# Patient Record
Sex: Male | Born: 1945 | Race: White | Hispanic: No | Marital: Married | Smoking: Former smoker
Health system: Southern US, Community
[De-identification: ages and names within clinical notes are randomized; demographics above are authoritative.]

## PROBLEM LIST (undated history)

## (undated) DIAGNOSIS — G4709 Other insomnia: Secondary | ICD-10-CM

## (undated) DIAGNOSIS — G4730 Sleep apnea, unspecified: Secondary | ICD-10-CM

## (undated) DIAGNOSIS — E559 Vitamin D deficiency, unspecified: Secondary | ICD-10-CM

## (undated) DIAGNOSIS — N400 Enlarged prostate without lower urinary tract symptoms: Secondary | ICD-10-CM

## (undated) DIAGNOSIS — I509 Heart failure, unspecified: Secondary | ICD-10-CM

## (undated) DIAGNOSIS — E785 Hyperlipidemia, unspecified: Secondary | ICD-10-CM

## (undated) DIAGNOSIS — R1312 Dysphagia, oropharyngeal phase: Secondary | ICD-10-CM

## (undated) DIAGNOSIS — M1990 Unspecified osteoarthritis, unspecified site: Secondary | ICD-10-CM

## (undated) DIAGNOSIS — E119 Type 2 diabetes mellitus without complications: Secondary | ICD-10-CM

## (undated) DIAGNOSIS — E1151 Type 2 diabetes mellitus with diabetic peripheral angiopathy without gangrene: Secondary | ICD-10-CM

## (undated) DIAGNOSIS — G7080 Lambert-Eaton syndrome, unspecified: Secondary | ICD-10-CM

## (undated) DIAGNOSIS — G119 Hereditary ataxia, unspecified: Secondary | ICD-10-CM

## (undated) DIAGNOSIS — J449 Chronic obstructive pulmonary disease, unspecified: Secondary | ICD-10-CM

## (undated) DIAGNOSIS — I2510 Atherosclerotic heart disease of native coronary artery without angina pectoris: Secondary | ICD-10-CM

## (undated) DIAGNOSIS — J690 Pneumonitis due to inhalation of food and vomit: Secondary | ICD-10-CM

## (undated) DIAGNOSIS — M069 Rheumatoid arthritis, unspecified: Secondary | ICD-10-CM

## (undated) DIAGNOSIS — D638 Anemia in other chronic diseases classified elsewhere: Secondary | ICD-10-CM

## (undated) DIAGNOSIS — K219 Gastro-esophageal reflux disease without esophagitis: Secondary | ICD-10-CM

## (undated) DIAGNOSIS — I110 Hypertensive heart disease with heart failure: Secondary | ICD-10-CM

## (undated) DIAGNOSIS — R918 Other nonspecific abnormal finding of lung field: Secondary | ICD-10-CM

## (undated) DIAGNOSIS — I739 Peripheral vascular disease, unspecified: Secondary | ICD-10-CM

## (undated) DIAGNOSIS — K529 Noninfective gastroenteritis and colitis, unspecified: Secondary | ICD-10-CM

## (undated) DIAGNOSIS — G709 Myoneural disorder, unspecified: Secondary | ICD-10-CM

## (undated) HISTORY — DX: Noninfective gastroenteritis and colitis, unspecified: K52.9

## (undated) HISTORY — DX: Dysphagia, oropharyngeal phase: R13.12

## (undated) HISTORY — DX: Myoneural disorder, unspecified: G70.9

## (undated) HISTORY — DX: Pneumonitis due to inhalation of food and vomit: J69.0

## (undated) HISTORY — DX: Anemia in other chronic diseases classified elsewhere: D63.8

## (undated) HISTORY — DX: Type 2 diabetes mellitus with diabetic peripheral angiopathy without gangrene: E11.51

## (undated) HISTORY — DX: Peripheral vascular disease, unspecified: I73.9

## (undated) HISTORY — DX: Other insomnia: G47.09

## (undated) HISTORY — DX: Hypertensive heart disease with heart failure: I11.0

## (undated) HISTORY — DX: Unspecified osteoarthritis, unspecified site: M19.90

## (undated) HISTORY — DX: Other nonspecific abnormal finding of lung field: R91.8

## (undated) HISTORY — DX: Vitamin D deficiency, unspecified: E55.9

## (undated) HISTORY — DX: Gastro-esophageal reflux disease without esophagitis: K21.9

## (undated) HISTORY — DX: Chronic obstructive pulmonary disease, unspecified: J44.9

## (undated) HISTORY — DX: Heart failure, unspecified: I50.9

## (undated) HISTORY — DX: Hereditary ataxia, unspecified: G11.9

## (undated) HISTORY — DX: Sleep apnea, unspecified: G47.30

---

## 2000-10-11 ENCOUNTER — Encounter (INDEPENDENT_AMBULATORY_CARE_PROVIDER_SITE_OTHER): Payer: Self-pay | Admitting: *Deleted

## 2000-10-11 ENCOUNTER — Encounter: Payer: Self-pay | Admitting: Emergency Medicine

## 2000-10-11 ENCOUNTER — Inpatient Hospital Stay (HOSPITAL_COMMUNITY): Admission: EM | Admit: 2000-10-11 | Discharge: 2000-10-19 | Payer: Self-pay | Admitting: Emergency Medicine

## 2000-10-12 ENCOUNTER — Encounter: Payer: Self-pay | Admitting: Neurology

## 2000-10-15 ENCOUNTER — Encounter: Payer: Self-pay | Admitting: Neurology

## 2000-10-17 ENCOUNTER — Encounter: Payer: Self-pay | Admitting: Neurology

## 2000-10-19 ENCOUNTER — Inpatient Hospital Stay (HOSPITAL_COMMUNITY)
Admission: RE | Admit: 2000-10-19 | Discharge: 2000-10-27 | Payer: Self-pay | Admitting: Physical Medicine & Rehabilitation

## 2000-11-01 ENCOUNTER — Encounter
Admission: RE | Admit: 2000-11-01 | Discharge: 2000-11-29 | Payer: Self-pay | Admitting: Physical Medicine & Rehabilitation

## 2000-11-29 ENCOUNTER — Encounter: Payer: Self-pay | Admitting: Neurology

## 2000-11-29 ENCOUNTER — Inpatient Hospital Stay (HOSPITAL_COMMUNITY): Admission: AD | Admit: 2000-11-29 | Discharge: 2000-12-04 | Payer: Self-pay | Admitting: Neurology

## 2000-11-30 ENCOUNTER — Encounter: Payer: Self-pay | Admitting: Neurology

## 2000-12-22 ENCOUNTER — Encounter (HOSPITAL_COMMUNITY): Admission: RE | Admit: 2000-12-22 | Discharge: 2001-03-22 | Payer: Self-pay | Admitting: Neurology

## 2001-01-05 ENCOUNTER — Encounter: Payer: Self-pay | Admitting: *Deleted

## 2001-01-09 ENCOUNTER — Encounter: Payer: Self-pay | Admitting: *Deleted

## 2001-01-09 ENCOUNTER — Ambulatory Visit (HOSPITAL_COMMUNITY): Admission: RE | Admit: 2001-01-09 | Discharge: 2001-01-09 | Payer: Self-pay | Admitting: *Deleted

## 2001-01-11 ENCOUNTER — Encounter (HOSPITAL_COMMUNITY): Admission: RE | Admit: 2001-01-11 | Discharge: 2001-04-11 | Payer: Self-pay | Admitting: Neurology

## 2001-03-03 ENCOUNTER — Encounter: Payer: Self-pay | Admitting: Neurology

## 2001-03-03 ENCOUNTER — Encounter: Admission: RE | Admit: 2001-03-03 | Discharge: 2001-03-03 | Payer: Self-pay | Admitting: Neurology

## 2001-03-08 ENCOUNTER — Inpatient Hospital Stay (HOSPITAL_COMMUNITY): Admission: AD | Admit: 2001-03-08 | Discharge: 2001-03-13 | Payer: Self-pay | Admitting: Neurology

## 2001-03-09 ENCOUNTER — Encounter: Payer: Self-pay | Admitting: Neurology

## 2001-03-10 ENCOUNTER — Encounter: Payer: Self-pay | Admitting: Neurology

## 2001-04-12 ENCOUNTER — Encounter: Admission: AD | Admit: 2001-04-12 | Discharge: 2001-07-11 | Payer: Self-pay | Admitting: Neurology

## 2001-08-03 ENCOUNTER — Encounter (HOSPITAL_COMMUNITY): Admission: RE | Admit: 2001-08-03 | Discharge: 2001-11-01 | Payer: Self-pay | Admitting: Neurology

## 2001-08-17 ENCOUNTER — Ambulatory Visit (HOSPITAL_COMMUNITY): Admission: RE | Admit: 2001-08-17 | Discharge: 2001-08-17 | Payer: Self-pay | Admitting: Neurology

## 2001-08-17 ENCOUNTER — Encounter: Payer: Self-pay | Admitting: Neurology

## 2001-09-22 ENCOUNTER — Encounter: Payer: Self-pay | Admitting: Neurology

## 2001-09-22 ENCOUNTER — Emergency Department (HOSPITAL_COMMUNITY): Admission: EM | Admit: 2001-09-22 | Discharge: 2001-09-23 | Payer: Self-pay | Admitting: Emergency Medicine

## 2001-10-04 ENCOUNTER — Encounter (HOSPITAL_COMMUNITY): Admission: RE | Admit: 2001-10-04 | Discharge: 2002-01-02 | Payer: Self-pay | Admitting: Neurology

## 2002-01-04 ENCOUNTER — Encounter (HOSPITAL_COMMUNITY): Admission: RE | Admit: 2002-01-04 | Discharge: 2002-04-04 | Payer: Self-pay | Admitting: Neurology

## 2002-04-16 ENCOUNTER — Ambulatory Visit (HOSPITAL_COMMUNITY): Admission: RE | Admit: 2002-04-16 | Discharge: 2002-04-16 | Payer: Self-pay | Admitting: Neurology

## 2002-04-16 ENCOUNTER — Encounter: Payer: Self-pay | Admitting: Neurology

## 2002-12-14 ENCOUNTER — Encounter: Payer: Self-pay | Admitting: Neurology

## 2002-12-14 ENCOUNTER — Encounter: Admission: RE | Admit: 2002-12-14 | Discharge: 2002-12-14 | Payer: Self-pay | Admitting: Neurology

## 2003-09-10 ENCOUNTER — Ambulatory Visit (HOSPITAL_COMMUNITY): Admission: RE | Admit: 2003-09-10 | Discharge: 2003-09-10 | Payer: Self-pay | Admitting: Neurology

## 2004-01-17 ENCOUNTER — Ambulatory Visit (HOSPITAL_COMMUNITY): Admission: RE | Admit: 2004-01-17 | Discharge: 2004-01-17 | Payer: Self-pay | Admitting: Neurology

## 2004-02-08 ENCOUNTER — Inpatient Hospital Stay (HOSPITAL_COMMUNITY): Admission: EM | Admit: 2004-02-08 | Discharge: 2004-02-12 | Payer: Self-pay | Admitting: Emergency Medicine

## 2004-02-08 HISTORY — PX: INTERTROCHANTERIC HIP FRACTURE SURGERY: SHX681

## 2005-11-25 ENCOUNTER — Ambulatory Visit (HOSPITAL_COMMUNITY): Admission: RE | Admit: 2005-11-25 | Discharge: 2005-11-25 | Payer: Self-pay | Admitting: Internal Medicine

## 2007-06-01 ENCOUNTER — Ambulatory Visit (HOSPITAL_BASED_OUTPATIENT_CLINIC_OR_DEPARTMENT_OTHER): Admission: RE | Admit: 2007-06-01 | Discharge: 2007-06-01 | Payer: Self-pay | Admitting: Orthopedic Surgery

## 2008-07-14 ENCOUNTER — Emergency Department (HOSPITAL_BASED_OUTPATIENT_CLINIC_OR_DEPARTMENT_OTHER): Admission: EM | Admit: 2008-07-14 | Discharge: 2008-07-14 | Payer: Self-pay | Admitting: Emergency Medicine

## 2008-07-14 ENCOUNTER — Ambulatory Visit: Payer: Self-pay | Admitting: Diagnostic Radiology

## 2008-12-06 ENCOUNTER — Ambulatory Visit (HOSPITAL_COMMUNITY): Admission: RE | Admit: 2008-12-06 | Discharge: 2008-12-06 | Payer: Self-pay | Admitting: Internal Medicine

## 2008-12-10 ENCOUNTER — Ambulatory Visit (HOSPITAL_COMMUNITY): Admission: RE | Admit: 2008-12-10 | Discharge: 2008-12-10 | Payer: Self-pay | Admitting: Internal Medicine

## 2010-07-23 ENCOUNTER — Other Ambulatory Visit (HOSPITAL_BASED_OUTPATIENT_CLINIC_OR_DEPARTMENT_OTHER): Payer: Self-pay | Admitting: Internal Medicine

## 2010-07-23 DIAGNOSIS — R918 Other nonspecific abnormal finding of lung field: Secondary | ICD-10-CM

## 2010-07-23 DIAGNOSIS — F17200 Nicotine dependence, unspecified, uncomplicated: Secondary | ICD-10-CM

## 2010-07-31 ENCOUNTER — Ambulatory Visit (HOSPITAL_COMMUNITY)
Admission: RE | Admit: 2010-07-31 | Discharge: 2010-07-31 | Disposition: A | Discharge status: Home / Self Care | Payer: MEDICARE | Source: Ambulatory Visit | Attending: Internal Medicine | Admitting: Internal Medicine

## 2010-07-31 DIAGNOSIS — J438 Other emphysema: Secondary | ICD-10-CM | POA: Insufficient documentation

## 2010-07-31 DIAGNOSIS — J90 Pleural effusion, not elsewhere classified: Secondary | ICD-10-CM | POA: Insufficient documentation

## 2010-07-31 DIAGNOSIS — J984 Other disorders of lung: Secondary | ICD-10-CM | POA: Insufficient documentation

## 2010-07-31 DIAGNOSIS — J8410 Pulmonary fibrosis, unspecified: Secondary | ICD-10-CM | POA: Insufficient documentation

## 2010-07-31 DIAGNOSIS — K7689 Other specified diseases of liver: Secondary | ICD-10-CM | POA: Insufficient documentation

## 2010-07-31 DIAGNOSIS — N62 Hypertrophy of breast: Secondary | ICD-10-CM | POA: Insufficient documentation

## 2010-07-31 DIAGNOSIS — F17200 Nicotine dependence, unspecified, uncomplicated: Secondary | ICD-10-CM

## 2010-07-31 DIAGNOSIS — I2510 Atherosclerotic heart disease of native coronary artery without angina pectoris: Secondary | ICD-10-CM | POA: Insufficient documentation

## 2010-07-31 DIAGNOSIS — R918 Other nonspecific abnormal finding of lung field: Secondary | ICD-10-CM

## 2010-07-31 MED ORDER — IOHEXOL 300 MG/ML  SOLN
80.0000 mL | Freq: Once | INTRAMUSCULAR | Status: AC | PRN
  Administered 2010-07-31: 80 mL via INTRAVENOUS

## 2010-10-02 NOTE — H&P (Signed)
. [facility]  Patient:    Justin Clements, Justin Clements                   MRN: [id number] Adm. Date:  [id number] Attending:  Lesly Dukes CC:         [place]. Andrey Campanile, M.D.  Thomas C. Wall, M.D. [facility]   History and Physical  CHIEF COMPLAINT: Justin Clements is a 65 year old right-handed white male, born 12/12/1945, with a history of coronary artery disease and hypercholesterolemia, who presents with a one week history of problems with staggering walking and dizziness.  HISTORY OF PRESENT ILLNESS: The patient blacked out two days prior to this admission.  He did not fall down with the brief syncope.  He has had a history of dropping mail from either hand over the last month or so.  The patient, however, developed worsening slurred speech and difficulty with ambulation tonight and was seeing brown spots in front of his eyes.  He did not have a headache and did not have nausea or vomiting.  He was seeing double.  The patient has recently been on some antibiotics for presumed sinus problem.  He had significant problems today and was brought into the hospital for evaluation.  CT scan of the head shows evidence of a small left pontine infarct, questionable age; otherwise CT is unremarkable.  The patient is admitted for full stroke evaluation.  PAST MEDICAL HISTORY:  1. New onset gait disturbance, slurred speech; rule out vertebrobasilar     insufficiency.  2. Coronary artery disease, history of MI at age 42.  3. History of hypercholesterolemia.  4. History of heel spur surgery.  5. History of deviated septum surgery.  6. History of benign prostatic hypertrophy.  7. History of peptic ulcer disease.  CURRENT MEDICATIONS:  1. Simvastatin 40 mg q.d.  2. Claritin 10 mg q.d.  3. Terazosin 10 mg q.h.s.  4. Aspirin one tablet q.d.  SOCIAL HISTORY: The patient smokes a pack of cigarettes daily and does not drink alcohol.  The patient is married  and lives in the [place] area.  He works as a Advertising account planner.  He has two children alive and well.  ALLERGIES: None.  FAMILY HISTORY: Mother died, history of diabetes.  Father died with cancer.  A sister died with diabetes.  A brother died with MI.  The patient has three living brothers.  REVIEW OF SYSTEMS: No fever or chills.  The patient does note some slight neck pain.  Denies shortness of breath, chest pain, bowel or bladder control problems.  PHYSICAL EXAMINATION:  VITAL SIGNS: Blood pressure 128/78, heart rate 70, respiratory rate 14. Afebrile.  GENERAL: This patient is a fairly well-developed white male, who is alert and cooperative at the time of examination.  HEENT: Head atraumatic.  PERRL, discs flat bilaterally.  NECK: Supple.  No carotid bruits noted.  CHEST: Clear to auscultation and percussion.  CARDIOVASCULAR: Regular rate and rhythm.  No obvious murmurs or rubs noted.  EXTREMITIES: Without significant edema.  NEUROLOGIC: Cranial nerves as above.  Facial symmetry present.  Good sensation of face to pinprick and soft touch bilaterally.  Good strength in facial muscles and muscles with head turning and shoulder shrug bilaterally.  Speech well enunciated, not aphasic.  Motor testing reveals 5/5 strength in all fours.  Good symmetric motor tone noted throughout.  Sensory testing intact to pinprick, soft touch, vibratory sensation throughout.  The patient may have some slight decrease  in pinprick sensation of the right hand as compared to the left.  The patient has good finger-to-nose-to-finger and toe-to-finger bilaterally.  The patient was not ambulated.  Deep tendon reflexes are symmetric and normal.  Toes neutral bilaterally.  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  WBC 7.0, hemoglobin 16.4, hematocrit 47.1; MCV 84.8; platelets 359,000. Urinalysis reveals a specific gravity of 1.014,  pH 5.5; otherwise unremarkable.  Sodium 137, potassium 3.8, chloride 110, CO2 24, glucose 123, BUN 12, creatinine 0.7.  Calcium 8.6, total protein 6.5, albumin 3.2, AST 19, ALT 22, alkaline phosphatase 103, total bilirubin 0.7.  EKG reveals normal sinus rhythm, possible inferior infarct - age indeterminate.  IMPRESSION:  1. History of gait disturbance, dysarthria, rule out vertebrobasilar     insufficiency.  2. Coronary artery disease.  3. Hypercholesterolemia.  This patient patient has ongoing tobacco abuse as well.  The patient has had new symptoms over the last week or so.  Will admit the patient for further evaluation.  Evidence of left pontine stroke noted by CT.  PLAN:  1. MRI scan of the brain.  2. MR angiogram.  3. Carotid Doppler study.  4. Obtain 2D echocardiogram.  5. IV heparin therapy.  6. Will follow the patients clinical course while in-house. DD:  10/11/00 TD:  10/12/00 Job: [id number]

## 2010-10-02 NOTE — Op Note (Signed)
[place]. [facility]  Patient:    Justin Clements, Justin Clements Visit Number: [id number] MRN: [id number]          Service Type: DSU Location: [facility] 2899 18 Attending Physician:  Caralee Ates Dictated by:   Caralee Ates, M.D. Proc. Date: 01/09/01 Admit Date:  01/09/2001 Discharge Date: 01/09/2001                             Operative Report  PREOPERATIVE DIAGNOSIS:  Multiple sclerosis.  POSTOPERATIVE DIAGNOSIS:  Multiple sclerosis.  PROCEDURE:  Right IJ Ash catheter placement.  SURGEON:  Caralee Ates, M.D.  ANESTHESIA:  MAC plus 1% lidocaine.  ESTIMATED BLOOD LOSS:  Minimal.  DRAINS:  None.  SPECIMENS:  None.  COMPLICATIONS:  None.  BRIEF HISTORY:  The patient is a 65 year old gentleman with multiple sclerosis and is felt to be in need of plasmapheresis.  CVTS is consulted for placement of an Ash catheter for his plasmapheresis.  The risks, benefits and alternatives of the procedure were explained in detail to the patient; he agreed to proceed.  DESCRIPTION OF PROCEDURE:  The patient was brought to the operating room and placed on the operating room table in the supine position.  Following adequate IV sedation, the right neck was draped and prepped in sterile fashion.  An area on the anterior of neck was anesthetized with 1% lidocaine and the right internal jugular vein was percutaneously punctured.  A guide wire was advanced through the needle into the [facility].  Next, a small stab incision was made at this level.  Next, an area on the anterior chest below the clavicle laterally was anesthetized with 1% lidocaine.  A small transverse incision was then made. An Ash catheter was then passed using the tunneling device through the subcutaneous plane and brought out into the upper wound.  Serial dilatation was performed over the guide wire, following by advancement of a large-caliber extensor and peel-away sheath system under direct fluoroscopic vision.   These and the guide wire were removed, and the Ash catheter was advanced down the peel-away sheath and the peel-away sheath was removed.  The tip of the catheter was then identified at the ______ junction; there is no kink in the insertion site.  Both ports were flushed and aspirated well.  The wound was closed with interrupted 4-0 Vicryl suture.  The skin was secured at the level of the skin with interrupted 4-0 nylon suture.  Sterile dressings were applied.  The patient was awakened from anesthesia and transferred to the recovery room in stable condition.  The patient tolerated the procedure well. No complications.  All needle and sponge counts were correct. Dictated by:   Caralee Ates, M.D. Attending Physician:  Caralee Ates. DD:  02/22/01 TD:  02/23/01 Job: [id number]

## 2010-10-02 NOTE — Discharge Summary (Signed)
[place]. [facility]  Patient:    Justin Clements, Justin Clements                   MRN: [id number] Adm. Date:  [id number] Disc. Date: 10/27/00 Attending:  Herold Clements Dictator:   Justin Clements, P.A. CCMarlan Clements, M.D.  Justin Clements, M.D.   Discharge Summary  DISCHARGE DIAGNOSES: 1. Cerebellar syndrome with encephalitis. 2. Hyperlipidemia. 3. Coronary artery disease with myocardial infarction.  HISTORY OF PRESENT ILLNESS:  This is a 65 year old, right-handed white male admitted on May 28, with progressive gait disorder, slurred speech and dizziness x 1 month.  There was no chest pain or shortness of breath.  Upon evaluation, cranial CT scan with small left pontine infarction of questionable age.  MRI with chronic infarction of the left pons.  MRA without aneurysm. Lumbar puncture on May 29, with wbcs 78, rbcs 2 and culture negative.  He had a neurology consult with Dr. 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  He was placed on intravenous Solu-Medrol and steroid taper.  A follow up lumbar puncture on June 3, showed an RBC of 3440, WBC 24,000, glucose 121.  He required minimal assistance for ambulation.  Fatigue factors with his gait being very unsteady.  Latest chemistries were unremarkable.  CT of the chest on June 1, was negative.  He was admitted for comprehensive rehabilitation program.  PAST MEDICAL HISTORY:  See discharge diagnoses.  Noted history of tobacco abuse.  ALLERGIES:  No known drug allergies.  PRIMARY PHYSICIAN:  Dr. Margrett Clements.  MEDICATIONS: 1. Zocor 40 mg daily. 2. Claritin 10 mg daily. 3. Terazosin 10 mg at bed time. 4. Aspirin daily.  SOCIAL HISTORY:  He lives in [place] with his wife.  He was independent prior to admission and employed with the postal service.  Wife to assist as needed on discharge.  He has one-level home with two steps to  entry.  HOSPITAL COURSE:  The patient did well on rehabilitation services with therapies initiated on a b.i.d. basis.  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  He continued on the steroid taper as per neurology services.  He remained afebrile.  He did have a bout of dizziness that showed no blood pressure changes that had resolved.  Overall for his functional mobility, he was ambulating 150 feet with a rolling walker and supervision, supervision for activities of daily living with dressing, grooming and homemaking.  Overall, his strength and endurance greatly improved.  The patient was encouraged with progress.  The plan was for outpatient therapies ongoing.  He was advised no driving at this time.  His aspirin was resumed for history of coronary artery disease with myocardial infarction.  He continued on his Zocor for his hyperlipidemia.  He had no bowel or bladder disturbances.  His mood and spirits continued to improve as he was followed by neuropsychologist, Dr. Eula Clements.  Latest labs showed a hemoglobin of 17.3, hematocrit 50, WBC 12.5 and platelets 345.  Sodium 137, potassium 3.8, BUN 18, creatinine 0.8.  DISCHARGE MEDICATIONS: 1. Hytrin 2 mg at bedtime. 2. Pepcid 20 mg twice daily. 3. Prednisone taper as directed. 4. Ecotrin 325 mg daily. 5. Multivitamin daily. 6. Zocor 20 mg daily.  ACTIVITY:  As tolerated.  DIET:  Regular.  SPECIAL INSTRUCTIONS:  No driving.  Outpatient physical and occupational therapy.  FOLLOWUP:  Arrangement appointments with Dr. Lesia Clements, call [phone], for neurology services.  Followup with Dr. Ellwood Clements, call [phone], for appointment.  He will continue to see Dr. Margrett Clements for his medical management. DD:   10/26/00 TD:  10/26/00 Job: [id number]

## 2010-10-02 NOTE — Discharge Summary (Signed)
[place]. [facility]  Patient:    Justin Clements, Justin Clements Visit Number: [id number] MRN: [id number]          Service Type: MED Location: 3000 3027 01 Attending Physician:  Lesly Dukes Dictated by:   Marlan Palau, M.D. Admit Date:  03/08/2001 Discharge Date: 03/13/2001   CC:         [facility], [address] [facility].                           Discharge Summary  ADMISSION DIAGNOSES: 1. 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 2. Recent onset of confusion, etiology unclear.  DISCHARGE DIAGNOSES: 1. 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 2. Confusion, possibly encephalopathy associated with #1.  PROCEDURES:  During this admission included: 1. MRI of the brain. 2. EEG study. 3. CT of the abdomen and pelvis.  COMPLICATIONS:  Of above procedures, none.  HISTORY OF PRESENT ILLNESS:  Justin Clements is a 65 year old white male born on 10/03/1945, with a history of coronary artery disease and hypercholesterolemia.  The patient has had a progressive cerebellar syndrome with associated with anti-LEMS antibodies.  The patient has had spinal taps x2 in the past with associated elevation and white cell counts.  The patient had developed some problem with confusion.  Some question of whether there may be some underlying depression is noted.  The patient was brought to the hospital for further evaluation, rule out any current illness changes on MRI scanning procedure.  PAST MEDICAL HISTORY: 1. 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 2. Coronary artery disease. 3. Hypercholesterolemia. 4. Heel spur surgery. 5. Deviated septum. 6. Benign prostatic hypertrophy. 7. Peptic ulcer disease. 8. Confusion as above.  MEDICATIONS:  Prior to admission include: 1. Zocor 20  mg a day. 2. Claritin 10 mg a day. 3. Terazosin 2 mg q.h.s. 4. Aspirin 81 mg daily. 5. Prednisone reduced to 40 mg a day. 6. Imuran 50 mg a day.  ALLERGIES:  No known allergies.  HABITS:  He has smoked a pack of cigarettes a day.  He is now off cigarettes. He does not drink alcohol.  Please refer to the history and physical dictation summary for social history, family history, review of systems, and physical examination.  LABORATORY VALUES:  Notable for TSH of 1.370, white count 11.1, hemoglobin 13.8, hematocrit 41.7, MCV 91.1, platelets 328, coags unremarkable.  Sodium 141, potassium 3.6, chloride 103, CO2 29, glucose 163, BUN 18, creatinine 0.8, calcium 9.1.  Total protein 5.3, albumin 3.9, AST of 24, ALT 38, ALP of 37, total bilirubin 0.7, TSH of 1.370.  CT of the abdomen and pelvis were unremarkable.  His following study was unremarkable.  MRI scan of the brain showed no acute changes.  Mild atrophy is seen without significant small-vessel disease.  CT of the chest was negative.  HOSPITAL COURSE:  The patient was admitted for further evaluation.  The prednisone dose was reduced slightly as this could potentially be a source of confusion.  The patient was reduced to 30 mg a day.  The patient did undergo the MRI scan of the brain that was unremarkable.  EEG study was done and it was unremarkable.  CT of the abdomen and pelvis was done to rule out adenocarcinoma.  This was unremarkable.  Prior CT of the  chest done before this admission was unremarkable.  There was some considerations whether a PET scan may be useful.  We will consider obtaining this following discharge.  The patient was seen by the case management team and question of whether any assistance in the home environment could be obtained.  It is not clear that this patient is eligible for this.  Physical, occupational, and speech therapy has seen this patient.  Swallowing evaluation was done.  It appeared to be good.   The patient has improved somewhat with mental status and was started on some Zoloft for possible depression at 25 mg a day.  This will be continued. The patient will continue on Imuran as well.  The patient is continued on plasmapheresis which was continued while in the hospital.  Plans are, at this point, for discharge to home on the 28th of October 2002. The patient has refused spinal tap during this hospitalization.  DISCHARGE MEDICATIONS: 1. Zocor 20 mg a day. 2. Claritin 10 mg a day. 3. Terazosin 2 mg q.h.s. 4. Aspirin 81 mg a day. 5. Prednisone 30 mg a day. 6. Imuran 50 mg daily. 7. Zoloft 25 mg a day.  The patient will follow up with [facility] in 4-6 weeks. Plasmapheresis will be continued twice a week.  The patient has made very little progress with the plasmapheresis and likely has progressed some while on this therapy.  Prognosis remains guarded. Dictated by:   Marlan Palau, M.D. Attending Physician:  Lesly Dukes DD:  03/12/01 TD:  03/14/01 Job: [id number]

## 2010-10-02 NOTE — Discharge Summary (Signed)
NAME:  Justin Clements, Justin Clements NO.:  [id number]   MEDICAL RECORD NO.:  [id number]          PATIENT TYPE:  INP   LOCATION:  5016                         FACILITY:  [facility]   PHYSICIAN:  Madlyn Frankel. Charlann Boxer, M.D.  DATE OF BIRTH:  1945-10-19   DATE OF ADMISSION:  02/08/2004  DATE OF DISCHARGE:  02/12/2004                                 DISCHARGE SUMMARY   ADMITTING DIAGNOSES:  1.  Left hip intertrochanteric femoral fracture.  2.  Coronary artery disease.  3.  Hypercholesterolemia.  4.  Rheumatoid arthritis.  5.  Benign prostatic hypertrophy.  6.  Progressive memory disturbance.  7.  History of progressive cerebellar ataxia, with EMS bodies, being      followed by Dr. Anne Hahn.  8.  History of duodenal ulcer.   DISCHARGE DIAGNOSES:  1.  Left hip intertrochanteric femoral fracture.  2.  Coronary artery disease.  3.  Hypercholesterolemia.  4.  Rheumatoid arthritis.  5.  Benign prostatic hypertrophy.  6.  Progressive memory disturbance.  7.  History of progressive cerebellar ataxia, with EMS bodies, being      followed by Dr. Anne Hahn.  8.  History of duodenal ulcer.   OPERATION:  On February 10, 2004, the patient under open reduction and  internal fixation left hip intertrochanteric femoral fracture utilizing  Synthes intertrochanteric nail.   CONSULTS:  1.  [facility] medical with Raynelle Fanning A. Mayford Knife, M.D., Rosanne Sack, M.D.  2.  Neurology consult, Dr. Pearlean Brownie.  3.  Dr. Mayford Knife of hospice palliative care.   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  He had immediate onset of  left hip pain.  X-rays in the emergency room revealed a nondisplaced  trochanteric fracture of the left hip.  CT scan was ordered which confirmed  the extension of the fracture through the trochanter.  This gentleman is a  relatively active fellow and was able to do transfers, doing some things  himself.  This type fracture is quite unstable for any kind of weightbearing  or ambulation or even transfers, and long bed rest would be the only way to  treat this so it could heal on its own.  We therefore elected to put gamma  nail in there so the patient could begin activity as soon as he was  comfortable postoperatively.   COURSE IN THE HOSPITAL:  The patient tolerated the surgical procedure quite  well.  Due to his physical deformity, he is very slow in his level of  activity.  He had minimal discomfort in the left lower extremity.  He was  somewhat apprehensive as far as his activity was concerned.  As a matter of  fact, reviewing the physical therapy records, they actually did minimal  therapy in the hospital.  This will be reserved, initiated, and continued at  [facility].   Orthopedically, the patient remained stable postoperatively.  Hemoglobin was  stable.  Neurovascularly, he remained intact in the left lower extremity.  On the day of discharge, the  patient was awake.  He was conversant as far as  he was able, having minimal discomfort in the left lower extremity.  Flexion  of the hip to about 40 degrees did not elicit any discomfort, and internal  and external rotation the same.  He was cleared by Dr. Jamie Brookes of [facility] for transfer back to [facility].  Once  those plans were in place, it was perfectly all right orthopedically for him  to be discharged.  We had allowed weightbearing as tolerated during his  hospitalization.   Laboratory values in the hospital hematologically showed a CBC with  differential showing a preoperative hemoglobin of 14.9, with hematocrit of  43.  He did have an elevated white count of 11.7 preop.  However, this  normalized to 7.1.  Final hemoglobin was 13.7, hematocrit was 40.8.  Blood  chemistries all were normal, other than a very slightly elevated glucose at  125.  Chest x-ray  showed mild cardiomegaly without evidence of acute  disease.  Electrocardiogram showed normal sinus rhythm, with rightward axis,  nonspecific T wave abnormal.   CONDITION ON DISCHARGE:  Improved, stable.   PLAN:  The patient is discharged to [facility] to  continue with treatment neurologically under Dr. Anne Hahn, and medically under  the attending physicians there.  Orthopedically, he may weightbear as  tolerated.  Staples may be removed at 10-14 days.  Use Steri-Strips as  needed.  Encourage range of motion of left lower extremity and ankle pumps.  He is to return to see Dr. Charlann Boxer in about six weeks.  Call for an appointment  at [phone].  This will probably be just a single visit with Dr. Charlann Boxer to  assess the x-rays and progress of the patient with his physical therapy as  able.  Patient to continue on Coumadin protocol four weeks after the date of  surgery, keeping the INR around 2.   MEDICATIONS AT DISCHARGE:  1.  Zoloft 50 mg one daily.  2.  Duragesic 75 mcg patch q.72 h.  3.  Calcium carbonate tablet 500 mg one t.i.d.  4.  Therapeutic vitamins 1 daily.  5.  Sodium chloride nasal spray 0.65 [place] at bedtime.  6.  Flomax 0.4 mg at bedtime.  7.  Toprol XL 25 mg one daily.  8.  Coumadin per pharmacy protocol.  9.  Colace 100 mg one b.i.d.  10. Pepcid 20 mg one p.o. b.i.d. p.r.n.  11. Vicodin for discomfort.  12. Ensure p.r.n.  13. Tylenol may be used on a p.r.n. basis as well.   The patient was being treated for constipation at the time of discharge and  received Dulcolax suppository on February 11, 2004.       DLU/MEDQ  D:  02/12/2004  T:  02/12/2004  Job:  [id number]   cc:   Marlan Palau, M.D.  [address]  [address]  [place]  [place] [address]  Fax: [phone]   Allen Kell, M.D.  [address]  [place], [place] [address]  Fax: [phone]

## 2010-10-02 NOTE — Consult Note (Signed)
NAME:  Justin Clements, Justin Clements NO.:  [id number]   MEDICAL RECORD NO.:  [id number]          PATIENT TYPE:  INP   LOCATION:  5016                         FACILITY:  [facility]   PHYSICIAN:  Pramod P. Pearlean Brownie, MD    DATE OF BIRTH:  March 21, 1946   DATE OF CONSULTATION:  02/09/2004  DATE OF DISCHARGE:                                   CONSULTATION   REASON FOR REFERRAL:  Cerebellar ataxia. Requesting preop neurological  clearance for hip surgery.   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  He has had  progressive speech disturbances as well as balance problems.  He has  progressed from walking independently to a walker, and for the last couple  of years he is unable to walk even with assistance.  He uses a wheelchair  most of  the time and is able to walk only short distances with walker with  assistance.  He had a fall and fractured his left hip and is awaiting hip  surgery.  We have been asked to consult for neurological clearance prior to  surgery.  The patient states he has had no recent worsening of his speech or  cerebellar ataxia.  He has been treated in the past by Dr. Lesia Sago from  our practice.  His anti-LEMS antibodies were positive.  His spinal tap had  shown increased white cells, but with normal glucose and protein.  MRI scan  of the brain did not reveal any evidence of encephalitis or any other  structural brain abnormality.  He was treated with prednisone as well as  several courses of plasma exchange without any clinical benefit.  He  recently had outpatient PET CT, which again, was negative for any  malignancy.   PAST MEDICAL HISTORY:  Significant for coronary artery disease,  hyperlipidemia, peptic ulcer, prostatic hypertrophy.   PAST SURGICAL HISTORY:  Nasal septum surgery, heel spur surgery.   PRESENT MEDICATIONS:  1.  Zoloft .  2.  Duragesic patch.  3.   Calcium  4.  Colace.  5.  Zantac.  6.  Restoril.  7.  Lortab.  8.  Flomax.  9.  Darvocet.   ALLERGIES:  None.   SOCIAL HISTORY:  The patient had smoked one pack a day for several years.  He quit three years ago and does not drink alcohol.  He is married and lives  in [place].  He used to work as a Advertising account planner.   FAMILY HISTORY:  Not significant for any cerebellar ataxia syndrome.   REVIEW OF SYSTEMS:  Significant for recent fall, hip pain.  No cough, chest  pain, diarrhea, shortness of breath.   PHYSICAL EXAMINATION:  GENERAL:  Pleasant middle age gentleman who is not in  distress.  VITAL SIGNS:  He is afebrile.  Pulse rate 70, respiratory rate 16.  Distal  pulses well felt.  HEENT:  Head nontraumatic.  ENT:  Unremarkable.  NECK:  Supple without bruit.  CARDIAC:  No murmur or gallop.  LUNGS:  Clear to auscultation.  NEUROLOGIC:  The patient is awake, alert and oriented x3.  There is no  aphasia.  He has severe dysarthria which is of the scanning cerebellar type.  He has increasing grimacing while speaking.  Eye movements are full range,  but there are dysmetric saccades bilaterally.  Visual acuity and fields are  adequate.  Face is symmetric.  Palate elevates normally.  Tongue is midline.  Motor system exam:  There is no focal weakness.  Left lower extremity muscle  strength testing is limited secondary to pain.  He has severe finger-to-nose  and knee-to-heel dysmetria.  Dysdiadochokinesia is present bilaterally.  Reflexes are brisk and symmetric.  Toes are downgoing.  Sensation is intact.  His gait was not tested.   DATA REVIEWED:  MRI scan of the brain in October 2002 is normal.  Recent PET  CT dated January 17, 2004 is negative without evidence of definite  malignancy.   IMPRESSION:  A 65 year old male with paraneoplastic (anti-LEMS) positive  antibody, cerebellar ataxia who is neurologically stable.  At the present  time, I do not have any specific neurological  concerns prior to his hip  surgery.  He may safely undergo surgery if necessary.  No further  neurological workup is needed at the present time.  He may follow as an  outpatient with Dr. Lesia Sago in the future as needed.  Thank you for the  referral.       PPS/MEDQ  D:  02/09/2004  T:  02/10/2004  Job:  [id number]

## 2010-10-02 NOTE — Procedures (Signed)
[place]  Patient:    Justin Clements, Justin Clements                   MRN: [id number] Proc. Date: 10/12/00 Adm. Date:  [id number] Attending:  Lesly Dukes                           Procedure Report  DATE OF BIRTH:  [birth year]  PROCEDURE:  Diagnostic lumbar puncture.  NEUROLOGIST:  Kelli Hope, M.D.  INDICATIONS:  Rule out encephalitis, rule out [employer] syndrome.  DESCRIPTION OF PROCEDURE:  The patient was placed in the left lateral decubitus position and then prepped and draped in the usual sterile fashion. Local anesthesia was achieved with 2 cc of lidocaine.  A 20-gauge spinal needle was inserted into the L3-4 interspace and advanced until clear spinal fluid was obtained.  Opening pressure was measured at 235 mm of water, approximately 10 cc of spinal fluid were obtained or sent for the following studies: Tube #1, cell count and differential.  Tube #2, glucose protein and monoclonal banding studies.  Tube #3, Gram stain and culture.  Tube #4 held for further tests as necessary.  Closing pressure was measured at 200 mm of water.  The needle was withdrawn, and hemostasis was obtained.  There were no immediate complications to the procedure.  The patient was advised to lie flat for 4 hours following procedure to minimize the risk of postop headache. DD:  10/12/00 TD:  10/12/00 Job: [id number]

## 2010-10-02 NOTE — Consult Note (Signed)
[place]. [facility]  Patient:    Justin Clements, Justin Clements Visit Number: [id number] MRN: [id number]          Service Type: EMS Location: [place] Attending Physician:  Doug Sou Dictated by:   Kelli Hope, M.D. Proc. Date: 09/22/01 Admit Date:  09/22/2001 Discharge Date: 09/23/2001                            Consultation Report  DATE OF BIRTH:  1945/12/01  HISTORY OF PRESENT ILLNESS:  Justin Clements is a 65 year old man, a patient of Dr. 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  He receives pheresis through a right internal jugular Ash catheter.  He comes here tonight with two complaints.  First of all, he has had intermittent swelling of the right upper extremity for the past 2-3 days, first noticed by his nurse assistant.  It was fairly significant and was able to be noticed by the patient and his wife.  They called Dr. Anne Hahn about it and he recommended ER assessment, but by the time he arrived at the emergency room the symptom was gone.  He also reports right upper quadrant pain which has been going on for the last three or four days.  He has also had nausea, vomiting, and diarrhea over that period of time.  He does not think that his pain is changed at all by any food.  PAST MEDICAL HISTORY:  As above.  He has developed a significant anemia on plasmapheresis.  MEDICATIONS:  Prednisone.  PHYSICAL EXAMINATION:  VITAL SIGNS:  Temperature 99.2, blood pressure 128/73, pulse 115, respirations 20.  GENERAL:  He is alert and in no distress.  NEUROLOGIC:  His speech is quit dysarthric and that is borderline for him.  He has ataxic movements of all extremities.  The right upper extremity does not appear swollen compared to the left.  There do not appear to be any dilated veins  and there is no pitting edema.  ABDOMEN:  Tender in the right upper quadrant, but there is no hepatomegaly or splenomegaly, no Murphys sign and there is no rebound tenderness or guarding.  WORKUP:  I spoke with Dr. Hart Rochester on call for cardiovascular surgery. He indicated that the symptoms had resolved and it was quite unlikely that he had a DVT affecting the right upper extremity and that more likely this was related to positioning of the Ash catheter, allowing drainage of the extremity.  Therefore, ultrasonographic testing of this was deferred.  He underwent CT of the abdomen which revealed no definite reason for his right upper quadrant pain.  IMPRESSION: 1. Right upper quadrant pain, uncertain etiology. 2. Right upper extremity edema, likely due to position of the Ash catheter,    but resolved.  PLAN:  The patients wife will follow up with Dr. Anne Hahn by phone next week if the edema recurs.  Consider right upper extremity Doppler.  If the abdominal pain persists, consider ultrasound of the abdomen. Dictated by:   Kelli Hope, M.D. Attending Physician:  Doug Sou DD:  09/22/01 TD:  09/25/01 Job: [id number]

## 2010-10-02 NOTE — Procedures (Signed)
[place]. [facility]  Patient:    Justin Clements, Justin Clements                   MRN: [id number] Adm. Date:  [id number] Attending:  Lesly Dukes                           Procedure Report  DESCRIPTION OF PROCEDURE:  This patient was prepped and draped in the left lateral decubitus position.  The L4-5 interspace was attempted x 4, but no spinal fluid was obtained.  The procedure was terminated at that point, and decided to do it tomorrow under fluoroscopy. DD:  10/16/00 TD:  10/17/00 Job: [id number]

## 2010-10-02 NOTE — Op Note (Signed)
NAME:  Justin Clements, Justin Clements NO.:  [id number]   MEDICAL RECORD NO.:  [id number]          PATIENT TYPE:  INP   LOCATION:  5016                         FACILITY:  [facility]   PHYSICIAN:  Madlyn Frankel. Charlann Boxer, M.D.  DATE OF BIRTH:  [birth year]   DATE OF PROCEDURE:  02/10/2004  DATE OF DISCHARGE:                                 OPERATIVE REPORT   PREOPERATIVE DIAGNOSIS:  Left hip intertrochanteric femur fracture.   POSTOPERATIVE DIAGNOSIS:  Left hip intertrochanteric femur fracture.   OPERATION PERFORMED:  Open reduction internal fixation, left hip  intertrochanteric femur fracture using a Synthes trochanteric nail 170 x 12  mm, 130 degrees with 100 mm lag helical blade and a single distal interlock.   SURGEON:  Madlyn Frankel. Charlann Boxer, M.D.   ASSISTANT:  None.   ANESTHESIA:  General.   ESTIMATED BLOOD LOSS:  200 mL.   FLUIDS REPLACED:  2 L.   DRAINS:  None.   COMPLICATIONS:  None.   INDICATIONS FOR PROCEDURE:  Justin Clements is a very pleasant 65 year old  gentleman with a significant medical history for Pincus Badder syndrome as  well as a history of coronary artery disease, status post myocardial  infarction.  He was in his normal state of health until he feel at his home.  He had immediate onset of left hip pain and was brought to the emergency  room.  Radiographs revealed a trochanteric fracture.  Based on the nature of  the fracture pattern it was uncertain initially whether or not this was a  complete intertrochanteric femur fracture so a CT scan was ordered. This CT  scan did confirm an intertrochanteric extension of the fracture.  Mr.  Clements was active only for transfers to and from bed to wheelchair to  restroom, does pivoting.  Despite this, this would represent an unstable  fracture for weightbearing and he would require a period of bed rest and  immobility for healing.  For this reason, the indication for surgery is  stabilization of fracture would permit easier  transfer and mobility with no  restriction in his weightbearing.  After reviewing risks and benefits with  him and discussing indications, he consents for the above procedure.   DESCRIPTION OF PROCEDURE:  The patient was brought to the operating room.  Once adequate anesthesia and preoperative antibiotics, 1 g Ancef were  administered, the patient was positioned supine.  His left lower extremity  was placed into a fracture shoe with padding around the foot.  The right  lower extremity was then flexed and abducted and padded particularly in the  peroneal nerve region.  Following this, x-ray was brought in to view and  reduction of the fracture carried out.  With the fracture reduced, the left  lower extremity was then prepped and draped in sterile fashion.  A 30 cm  incision was made proximal to the trochanter based on the radiographs.  The  femur was then instrumented per protocol for the Synthes troch nail with a  guidewire followed by overreaming followed by passage of the 170 mm x 12 mm  trochanteric nail 130 degrees.  With  the nail reduced to appropriate depth,  a guidewire was passed into the femoral neck into the center portion of the  femoral head in both the AP and lateral planes.  Following this, it was  overreamed and measured to be a 100 mm nail.  With the helical blade in  place, compression was applied to help reduce the fracture.  Following this,  the nail was locked into position to allow for sliding and compression with  weightbearing.  Following this, the guides were all removed.  The  instrumentation was removed.  The wounds were copiously irrigated with  normal saline solution.  The proximal gluteal fascia was reapproximated  using 0 Vicryl.  The remainder of the wound was closed in layers with  staples on the skin.  The wounds were cleaned, dried and dressed sterilely  with Adaptic dressing sponges and tape.  The patient tolerated the procedure  without  complications.   He will be weightbearing as tolerated.       MDO/MEDQ  D:  02/10/2004  T:  02/11/2004  Job:  [id number]

## 2010-10-02 NOTE — H&P (Signed)
[place]. [facility]  Patient:    Justin Clements, Justin Clements                   MRN: [id number] Adm. Date:  [id number] Disc. Date: [id number] Attending:  Herold Harms CC:         [place]. Andrey Campanile, M.D.  [facility] [address] [place]   History and Physical  HISTORY OF PRESENT ILLNESS:  Justin Clements is a 65 year old right-handed white male born 02/12/1946 with a history of coronary artery disease, hypercholesterolemia, who presented to [facility] on Oct 11, 2000, with some problems with increasing gait disturbance.  The patient also complained of some double vision and had had some trouble with dropping things over the last month prior to that admission.  The patient developed worsening slurred speech, was seeing brown spots in front of his eyes, did not have a headache, did not have nausea or vomiting.  The patient initially was felt to have possibly cerebrovascular disease.  CT of the head showed a questionable left pontine stroke.  The patient was brought in for stroke evaluation but was found to have no evidence of an acute stroke by CT scan.  A possible pontine ischemic event was noted by MRI scan.  The patient had a negative stroke work-up otherwise with a 2-D echocardiogram, carotid Doppler study, MR angiogram.  The patient underwent lumbar puncture and was found to have normal protein level of 42, glucose that was normal at 64.  The patient, however, had 2 red cells and 78 white cells, predominant lymphocytes.  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  The patient was placed on prednisone and tapered down off this over the next several weeks.  A perineoplastic panel was sent out but has recently been returned showing evidence of a positive LEMS antibody.  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  Spinal fluid cytology was unremarkable previously.  PAST MEDICAL HISTORY:  Significant for: 1. History of progressive gait disturbance, dysarthria, thought secondary to a    perineoplastic syndrome. 2. History of coronary artery disease, history of MI at age 27. 3. History of hypercholesterolemia. 4. History of heel spur surgery. 5. History of deviated septum surgery. 6. History of benign prosthetic hypertrophy. 7. History of peptic ulcer disease.  MEDICATIONS: 1. Simvastatin 40 mg a day. 2. Claritin 10 mg a day. 3. Terazosin 10 mg q.h.s. 4. Aspirin 1 tablet a day.  HABITS:  The patient smokes 1 pack of cigarettes a day.  He does not drink alcohol.  SOCIAL HISTORY:  The patient is married.  He lives in the [place] area.  He works as a Tree surgeon.  He has two children who are alive and well.  FAMILY AND MEDICAL HISTORY:  Notable for the fact that the patients mother died with a history of diabetes.  Father died of cancer.  Patients sister died with diabetes.  Brother died with an MI.  Patient has 3 living brothers.  REVIEW OF SYSTEMS:  Notable for no fevers, chills.  Patient denies headache. Does note double vision.  Has had some slurred speech.  Denies any problems swallowing.  Denies any numbness on the extremities.  The patient denies any problems controlling the bowels or the bladder.  The patient does note a history of recent twitching and jerking episodes.  An EEG done through our office was unremarkable.  PHYSICAL EXAMINATION:  VITAL SIGNS:  Blood pressure is 112/68, heart rate 84 regular.  GENERAL:  The patient is a minimally obese white male who is alert and cooperative at this time.  HEENT:  Head is atraumatic.  Eyes: Pupils equal, round, and reactive to light. Disks:  Soft, flat bilaterally.  Extraocular movements relatively full.  Some end gaze nystagmus is seen bilaterally.  Speech was slightly dysarthric.  NECK:  Supple.  No  carotid bruits noted.  RESPIRATORY EXAMINATION:  Clear.  CARDIOVASCULAR:  Reveals regular rate and rhythm.  No obvious murmurs or rubs noted.  EXTREMITIES:  Without significant edema.  NEUROLOGIC:  Cranial nerves are above.  Facial symmetry is full.  The patient has good sensation to face to pinprick and soft touch bilaterally.  Visual fields are full.  Speech again is dysarthric.  Not aphasic.  Motor testing reveals good strength in all fours.  The patient has clear ataxia with both upper extremities, a bit worse on the left than the right, with some ataxia heel to shin as well.  The patient has symmetric reflux throughout.  Toes are neutral.  The patient is able to stand.  She can ambulate with assistance. The patient has inability to perform tandem gait.  Romberg is positive. Patient has fairly symmetric pinprick to soft touch and vibratory sensation throughout.  LABORATORY DATA:  Values on admission are pending.  CHEST X-RAY AND EKG:  Pending.  IMPRESSION: 1. 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 2. History of coronary artery disease. 3. History of hypercholesterolemia.  This patient has a positive LEMS antibody that can be associated with small cell carcinoma of the lung in 50% of cases.  This antibody is sometimes associated with other tumor such as lymphoma, breast cancer and colon cancer. The patient will be admitted T this time for treatment of the above problem. Will initiate IV IgG therapy and repeat chest CT scan.  Will also perform CT scan of the abdomen and pelvis.  The patient will have ongoing and occupational therapy while in the hospital.  PLAN: 1. IV IgG over 5 days. 2. Physical and occupational therapy. 3. CT of the chest, abdomen and pelvis. 4. Consider repeat lumbar puncture at some point. 5. The patient will probably need to go back on prednisone dosing upon    discharge from the hospital. DD:  11/28/00 TD:   11/28/00 Job: [id number]

## 2010-10-02 NOTE — H&P (Signed)
[place]. [facility]  Patient:    Justin Clements Visit Number: [id number] MRN: [id number]          Service Type: MED Location: [id number] Attending Physician:  Lesly Dukes Dictated by:   Marlan Palau, M.D. Admit Date:  03/08/2001   CC:         [facility], [address] [place], [place]   History and Physical  HISTORY OF PRESENT ILLNESS: Justin Clements is a 65 year old right-handed white male, born 12-14-1945, with a history of coronary artery disease, hypercholesterolemia.  The patient has had a progressive cerebellar syndrome that was felt associated with anti-LEMS antibodies.  This patient has had lumbar punctures done previously, with evidence of increased white cells, normal glucose, and normal protein.  The patient had 78 white cells, predominant lymphocytes.  The patient initially was felt to have a brain stem encephalitis but the perineoplastic antibody, as above, came back positive. The patient has been receiving prednisone dosing and is on plasmapheresis, but has continued to have progression of his clinical syndrome.  Within the last four or five days the patient has had increasing confusion and encephalopathy. The patient is asking repetitive questions and clearly is confused.  The patient is brought into the hospital at this point for evaluation of decline in clinical status.  PAST MEDICAL HISTORY:  1. History of progressive cerebellar syndrome, thought secondary to     perineoplastic syndrome.  2. Coronary artery disease, history of MI at age 27.  3. History of hypercholesterolemia.  4. History of heel spur surgery.  5. History of deviated septum surgery.  6. History of benign prostatic hypertrophy.  7. History of peptic ulcer disease.  MEDICATIONS:  1. Zocor 20 mg q.d.  2. Claritin 10 mg q.d.  3. Terazosin 2 mg q.h.s.  4. Aspirin 81 mg q.d.  5. Prednisone, currently on 40 mg  q.d.  6. Imuran 50 mg q.d.  ALLERGIES: None known.  SOCIAL HISTORY: The patient had smoked a pack of cigarettes a day, now off cigarettes.  Does not drink alcohol.  This patient is married and lives in the [place] area.  Had worked as a Advertising account planner.  Has two children, who are alive and well.  FAMILY HISTORY: The patients mother died with diabetes, father died of cancer.  Sister died with diabetes.  Brother died with an MI.  The patient has three living brothers.  REVIEW OF SYSTEMS: Notable for no recent fever or chills.  The patient denied significant fainting spells.  He denies any problems with breathing, chest pain.  Has had some difficulty swallowing.  Speech is quite dysarthric.  The patient has not had trouble controlling the bowels or the bladder.  Increased confusion is noted.  PHYSICAL EXAMINATION:  VITAL SIGNS: Blood pressure 124/70.  Heart rate 84.  Respiratory rate 18. Afebrile.  GENERAL: This patient is a well-developed white male, who is alert and cooperative at the time of examination.  HEENT: Atraumatic.  Eyes, PERRL.  Discs flat bilaterally.  NECK: Supple.  No carotid bruits noted.  RESPIRATORY: Clear.  CARDIOVASCULAR: Regular rate and rhythm.  No obvious murmurs or rubs noted.  ABDOMEN: Without tenderness.  EXTREMITIES: Without significant edema.  NEUROLOGIC: Cranial nerves as noted above.  Facial symmetry present.  The patient has good sensation to face with pinprick and soft touch bilaterally. The patient has full visual fields.  Speech is quite dysarthric.  Motor testing reveals good  strength of all fours but there is significant ataxia of both upper extremities, a bit worse on the left than the right, with severe dysmetria.  The patient has similar dysmetria with the lower extremities.  The patient is not able to ambulate.  The patient has good symmetry to pinprick, soft touch, and vibratory sensation throughout.  LABORATORY DATA:  Notable for sodium 144, potassium 3.3, chloride 109, CO2 26, glucose 186, BUN 17, creatinine 0.8.  Potassium 7.8, total protein 5.2, AST 28, ALT 32, alkaline phosphatase 34, total bilirubin 0.6.  WBC 12.3, hemoglobin 13.6, hematocrit 40.0; MCV 91.3; platelets 273,000.  IMPRESSION:  1. 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  2. Recent onset of confusion.  This patient is not doing well clinically and has had significant worsening of clinical deficits, with progression of cerebellar ataxia, now presenting with confusion.  PLAN: The patient is admitted for further work-up at this point.  Will need to re-evaluate for possible other process such as PML, Jakob-Creutzfeldt disease, etc., and rule out subclinical seizure events.  1. MRI scan of the brain.  2. EEG study.  3. Consider lumbar puncture.  4. Physical, occupational, and speech therapy evaluation.  5. Social services evaluation for discharge planning.  6. Will follow the patients clinical course while in-house. Dictated by:   Marlan Palau, M.D. Attending Physician:  Lesly Dukes DD:  03/09/01 TD:  03/10/01 Job: [id number]

## 2011-02-04 LAB — POCT HEMOGLOBIN-HEMACUE: Hemoglobin: 16.7

## 2011-12-03 ENCOUNTER — Other Ambulatory Visit (HOSPITAL_BASED_OUTPATIENT_CLINIC_OR_DEPARTMENT_OTHER): Payer: Self-pay | Admitting: Internal Medicine

## 2011-12-03 ENCOUNTER — Ambulatory Visit
Admission: RE | Admit: 2011-12-03 | Discharge: 2011-12-03 | Disposition: A | Discharge status: Home / Self Care | Payer: Medicaid Other | Source: Ambulatory Visit | Attending: Internal Medicine | Admitting: Internal Medicine

## 2011-12-03 DIAGNOSIS — L905 Scar conditions and fibrosis of skin: Secondary | ICD-10-CM

## 2012-08-20 DIAGNOSIS — J4489 Other specified chronic obstructive pulmonary disease: Secondary | ICD-10-CM

## 2012-08-20 DIAGNOSIS — E1165 Type 2 diabetes mellitus with hyperglycemia: Secondary | ICD-10-CM

## 2012-08-20 DIAGNOSIS — IMO0001 Reserved for inherently not codable concepts without codable children: Secondary | ICD-10-CM

## 2012-08-20 DIAGNOSIS — J449 Chronic obstructive pulmonary disease, unspecified: Secondary | ICD-10-CM

## 2012-09-29 DIAGNOSIS — I2510 Atherosclerotic heart disease of native coronary artery without angina pectoris: Secondary | ICD-10-CM

## 2012-09-29 DIAGNOSIS — I110 Hypertensive heart disease with heart failure: Secondary | ICD-10-CM

## 2012-09-29 DIAGNOSIS — I509 Heart failure, unspecified: Secondary | ICD-10-CM

## 2012-10-20 DIAGNOSIS — D649 Anemia, unspecified: Secondary | ICD-10-CM

## 2012-10-20 DIAGNOSIS — H833X9 Noise effects on inner ear, unspecified ear: Secondary | ICD-10-CM

## 2012-10-20 DIAGNOSIS — M069 Rheumatoid arthritis, unspecified: Secondary | ICD-10-CM

## 2012-11-24 ENCOUNTER — Non-Acute Institutional Stay (SKILLED_NURSING_FACILITY): Payer: PRIVATE HEALTH INSURANCE | Admitting: Internal Medicine

## 2012-11-24 DIAGNOSIS — R1314 Dysphagia, pharyngoesophageal phase: Secondary | ICD-10-CM

## 2012-11-24 DIAGNOSIS — G7080 Lambert-Eaton syndrome, unspecified: Secondary | ICD-10-CM

## 2012-11-24 DIAGNOSIS — J189 Pneumonia, unspecified organism: Secondary | ICD-10-CM

## 2012-11-24 NOTE — Progress Notes (Signed)
Patient ID: Justin Clements, male   DOB: 01-03-1946, 67 y.o.   MRN: [id number] Facility; [facility]. Cc; monthly [facility] visit His; patient has been in the building since 2005. As far as I can tell. Major disability is that of [employer] Syndrome. He does not have an associated malignancy. His illness has been very gradually progressive over time. I have episodically wondered about the accuracy of his diagnoses, however, in discussion with him many years ago did not show that he wanted neurologic followup. He also has rheumatoid arthritis and progressive cerebellar ataxia. He was treated for a pneumonia. This month with some suggestion of aspiration, for which he has been seen by speech therapy.   Past medical history; #1 coronary artery disease. #2 rheumatoid arthritis. #3 left hip fracture. #4 hyperlipidemia. #5 type 2 diabetes. #6 BPH. #7 progressive cerebellar ataxia/eaton lambert syndrome;  #8 successfully treated for pneumonia.  #9 continued tobacco abuse.  Medication list is reviewed per nursing home [facility]   Physical examination O2 sat is 92% on room air, pulse rate 77, respiratory rate 20, blood pressure 119/77. Respiratory shallow air entry bilaterally. No crackles or wheezes. Cardiac heart sounds are normal. No murmurs. Musculoskeletal rheumatoid nodules, but no active joints.  Impression/plan #1 successfully treated for a pneumonia. He has been followed by speech therapy. However, he apparently declined their services. Chest x-ray showed left lung consolidation and pleural effusion. The patient also received antibiotics 3 weeks ago. Requires a chest CT, which probably should be done in a month #2 progressive neurologic deficit. Deficits with cerebellar ataxia, cerebellar speech, he is still able to ambulate with a walker with assistance, he does this a pretty much daily with restored. #3 Pincus Badder syndrome. He has tolerated this remarkably well over the years.

## 2012-12-22 ENCOUNTER — Telehealth: Payer: Self-pay | Admitting: *Deleted

## 2012-12-22 ENCOUNTER — Encounter: Payer: Self-pay | Admitting: Internal Medicine

## 2012-12-22 ENCOUNTER — Ambulatory Visit (INDEPENDENT_AMBULATORY_CARE_PROVIDER_SITE_OTHER): Payer: Medicare Other | Admitting: Internal Medicine

## 2012-12-22 VITALS — BP 128/70 | HR 81 | Ht 70.0 in | Wt 220.0 lb

## 2012-12-22 DIAGNOSIS — R918 Other nonspecific abnormal finding of lung field: Secondary | ICD-10-CM | POA: Insufficient documentation

## 2012-12-22 DIAGNOSIS — J449 Chronic obstructive pulmonary disease, unspecified: Secondary | ICD-10-CM | POA: Insufficient documentation

## 2012-12-22 DIAGNOSIS — F17200 Nicotine dependence, unspecified, uncomplicated: Secondary | ICD-10-CM

## 2012-12-22 HISTORY — DX: Other nonspecific abnormal finding of lung field: R91.8

## 2012-12-22 HISTORY — DX: Chronic obstructive pulmonary disease, unspecified: J44.9

## 2012-12-22 NOTE — Assessment & Plan Note (Addendum)
ddx = recurrent asp vs RA lung dz> strongly favor asp but not enough to warrant abx at this point nor change in diet.

## 2012-12-22 NOTE — Telephone Encounter (Signed)
Message copied by Christen Butter on Fri Dec 22, 2012  4:19 PM ------      Message from: Sandrea Hughs B      Created: Fri Dec 22, 2012  2:36 PM       Call nursing home and be sure then understand the fish oil should be stopped for now (the diet said no oil based vitamins but I forgot specifically to d/c this one ------

## 2012-12-22 NOTE — Telephone Encounter (Signed)
Called and spoke to Leotis Shames, pt's nurse Order faxed to her to have the pt d/c fish oil

## 2012-12-22 NOTE — Patient Instructions (Addendum)
You should use the nebulizer up to 4 x daily as needed  The key is to stop smoking completely before smoking completely stops you!   Be careful with swallowing   You need to be completely  off the ace inhibitor for 6 weeks before you return for follow up visit to regroup regarding your longterm needs - you will pft's on return.

## 2012-12-22 NOTE — Assessment & Plan Note (Signed)

## 2012-12-22 NOTE — Assessment & Plan Note (Addendum)
DDX of  difficult airways managment all start with A and  include Adherence, Ace Inhibitors, Acid Reflux, Active Sinus Disease, Alpha 1 Antitripsin deficiency, Anxiety masquerading as Airways dz,  ABPA,  allergy(esp in young), Aspiration (esp in elderly), Adverse effects of DPI,  Active smokers, plus two Bs  = Bronchiectasis and Beta blocker use..and one C= CHF  ? acei effect > trial off strongly rec  ? Asp/gerd > low threshold to repeat swallowing studies   Active smoking > discussed separately  See instructions for specific recommendations which were reviewed directly with the patient who was given a copy with highlighter outlining the key components > need f/u ov with pfts off acei if possible

## 2012-12-22 NOTE — Progress Notes (Signed)
Subjective:     Patient ID: Justin Clements, male   DOB: 1945/08/21   MRN: [id number]  HPI  89 yowm NHP 2005 dx 2002 with cerebellar degen and RA with ? [facility] for abn cxr  12/22/2012  1st pulmonary eval / Sherene Sires on acei/ smoker cc sev months of cough clear liquid with one episode of hemoptysis x one calf of mucus traces of brpb and some fever. Has not used neb in weeks, does intermittently choke on food, no recent abx.  No classically pleuritic cp or sob at rest.  No obvious daytime variabilty or assoc  r chest tightness, subjective wheeze overt sinus or hb symptoms. No unusual exp hx or h/o childhood pna/ asthma or knowledge of premature birth.    Sleeping ok without nocturnal  or early am exacerbation  of respiratory  c/o's or need for noct saba. Also denies any obvious fluctuation of symptoms with weather or environmental changes or other aggravating or alleviating factors except as outlined above   Current Medications, Allergies, Past Medical History, Past Surgical History, Family History, and Social History were reviewed in [employer] record.  ROS  The following are not active complaints unless bolded sore throat, dysphagia, dental problems, itching, sneezing,  nasal congestion or excess/ purulent secretions, ear ache,   fever, chills, sweats, unintended wt loss, pleuritic or exertional cp, hemoptysis,  orthopnea pnd or leg swelling, presyncope, palpitations, heartburn, abdominal pain, anorexia, nausea, vomiting, diarrhea  or change in bowel or urinary habits, change in stools or urine, dysuria,hematuria,  rash, arthralgias, visual complaints, headache, numbness weakness or ataxia or problems with walking or coordination,  change in mood/affect or memory.         Review of Systems     Objective:   Physical Exam W/c bound chronically ill appearing  wm with cerebellar pattern dysarthria   Wt Readings from Last 3 Encounters:   12/22/12 220 lb (99.791 kg)     HEENT: nl dentition, turbinates, and orophanx. Nl external ear canals without cough reflex   NECK :  without JVD/Nodes/TM/ nl carotid upstrokes bilaterally   LUNGS: no acc muscle use, bilaterally insp and exp rhonchi, no true wheeze, no rub   CV:  RRR  no s3 or murmur or increase in P2, no edema   ABD:  soft and nontender with nl excursion.No bruits or organomegaly, bowel sounds nl  MS:  warm without deformities, calf tenderness, cyanosis or clubbing / extensor nodules  SKIN: warm and dry without lesions    NEURO:  alert, approp severe cerebellar ataxia.        cxr 12/19/12 [facility] xray report (films not available)  Stable L Pl effusion vs scar, new parenchymal changes/ and or thickening R base Assessment:

## 2013-01-07 ENCOUNTER — Non-Acute Institutional Stay (SKILLED_NURSING_FACILITY): Payer: PRIVATE HEALTH INSURANCE | Admitting: Internal Medicine

## 2013-01-07 DIAGNOSIS — J189 Pneumonia, unspecified organism: Secondary | ICD-10-CM

## 2013-01-07 DIAGNOSIS — M069 Rheumatoid arthritis, unspecified: Secondary | ICD-10-CM

## 2013-01-07 DIAGNOSIS — G7080 Lambert-Eaton syndrome, unspecified: Secondary | ICD-10-CM

## 2013-01-07 NOTE — Progress Notes (Signed)
Patient ID: Justin Clements, male   DOB: 1945/10/10, 67 y.o.   MRN: [id number]         Patient ID: Justin Clements, male   DOB: 1945-08-31, 67 y.o.   MRN: [id number] Facility; [facility]. Cc; monthly [facility] visit for july His; patient has been in the building since 2005. As far as I can tell. Major disability is that of [employer] Syndrome. He does not have an associated malignancy. His illness has been very gradually progressive over time. I have episodically wondered about the accuracy of his diagnoses, however, in discussion with him many years ago did not show that he wanted neurologic followup. He also has rheumatoid arthritis and progressive cerebellar ataxia. He was treated for a pneumonia. This month with some suggestion of aspiration, for which he has been seen by speech therapy.     This month he complains of pain in his left wrist. There is no history of trauma. He has also been undergoing evaluation by speech therapy he had a fees study which recommended continued regular diet with thin liquids. He has also been seen by Dr. Sherene Sires of pulmonary medicine for cough and blood-tinged sputum. He seems to be that he either has aspiration pneumonia/pneumonitis [this was actually favor] vs. rheumatoid lung. 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 #2 rheumatoid arthritis. #3 left hip fracture. #4 hyperlipidemia. #5 type 2 diabetes. #6 BPH. #7 progressive cerebellar ataxia/eaton lambert syndrome;   #8 successfully treated for pneumonia.   #9 continued tobacco abuse.  Medication list is reviewed per nursing home [facility]   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 He feels this is tender he does not complain of other  lateralizing joint pain   Physical examination O2 sat is 92% on room air, pulse rate 77, respiratory rate 20, blood pressure 119/77. Respiratory shallow air entry bilaterally. No crackles or wheezes. Cardiac heart sounds are normal. No murmurs. Musculoskeletal rheumatoid nodules, there is significant tenderness in his left wrist which is somewhat tender especially on the ventral aspect. Impression/plan #1 successfully treated for a pneumonia. He has been followed by speech therapy. However, he apparently declined their services. Chest x-ray showed left lung consolidation and pleural effusion. The patient also received antibiotics 3 weeks ago. Requires a chest CT, which probably should be done in a month. He is also followed by Dr. Sherene Sires. He recently underwent a fees study. He actually passes quite well. Pulmonary function tests would also be interesting  #2 progressive neurologic deficit. Deficits with cerebellar ataxia, cerebellar speech, he is still able to ambulate with a walker with assistance, he does this a pretty much daily with restorative.  #3 Pincus Badder syndrome. He has tolerated this remarkably well over the years. #4 rheumatoid arthritis; I am really uncertain about whether the left wrist is related to this or not he is already on Celebrex 200 mg a day as well as plaquanil. I will give him a course of prednisone.

## 2013-02-05 ENCOUNTER — Encounter: Payer: Self-pay | Admitting: Internal Medicine

## 2013-02-05 ENCOUNTER — Ambulatory Visit (INDEPENDENT_AMBULATORY_CARE_PROVIDER_SITE_OTHER): Payer: Medicare Other | Admitting: Internal Medicine

## 2013-02-05 VITALS — BP 106/60 | HR 94 | Temp 97.5°F

## 2013-02-05 DIAGNOSIS — R918 Other nonspecific abnormal finding of lung field: Secondary | ICD-10-CM

## 2013-02-05 DIAGNOSIS — J449 Chronic obstructive pulmonary disease, unspecified: Secondary | ICD-10-CM

## 2013-02-05 DIAGNOSIS — F17200 Nicotine dependence, unspecified, uncomplicated: Secondary | ICD-10-CM

## 2013-02-05 DIAGNOSIS — J4489 Other specified chronic obstructive pulmonary disease: Secondary | ICD-10-CM

## 2013-02-05 NOTE — Patient Instructions (Addendum)
The key is to stop smoking completely before smoking completely stops you!   You will need a follow up cxr and have the report faxed to [phone]   Pulmonary follow up is as needed

## 2013-02-05 NOTE — Assessment & Plan Note (Addendum)
-   rec trial off acei 12/22/2012  - Spirometry 02/05/2013 > uninterpretable F/V but doe not appear to have severe obst   Seems better off acei and on nebs, no change rx needed x stop smoking (discussed separately)

## 2013-02-05 NOTE — Progress Notes (Signed)
Subjective:     Patient ID: Justin Clements, male   DOB: 05/08/1946   MRN: [id number]  Brief patient profile:  65 yowm  Smoker NHP 2005 dx 2002 with cerebellar degen and RA with ? [facility] for abn cxr  12/22/2012  1st pulmonary eval / Sherene Sires on acei/ smoker cc sev months of cough clear liquid with one episode of hemoptysis x one cup of mucus traces of brpb and some fever. Has not used neb in weeks, does intermittently choke on food, no recent abx.  No classically pleuritic cp or sob at rest. rec You should use the nebulizer up to 4 x daily as needed The key is to stop smoking completely before smoking completely stops you!  Be careful with swallowing    02/05/2013 f/u ov/Annie Roseboom still smoking re: hemoptysis gone / swallowing better  Chief Complaint  Patient presents with  . Follow-up    Pt states that his cough is the about the same since the last visit. Has had  no more hemoptysis- only coughs up minimal white sputum, esp at night.    no limiting doe but very inactive   No obvious day to day or daytime variabilty or assoc  Cp, subjective wheeze overt sinus or hb symptoms. No unusual exp hx or h/o childhood pna/ asthma or knowledge of premature birth.    Sleeping ok without nocturnal  or early am exacerbation  of respiratory  c/o's or need for noct saba. Also denies any obvious fluctuation of symptoms with weather or environmental changes or other aggravating or alleviating factors except as outlined above   Current Medications, Allergies, Past Medical History, Past Surgical History, Family History, and Social History were reviewed in [employer] record.  ROS  The following are not active complaints unless bolded sore throat, dysphagia, dental problems, itching, sneezing,  nasal congestion or excess/ purulent secretions, ear ache,   fever, chills, sweats, unintended wt loss, pleuritic or exertional cp, hemoptysis,  orthopnea pnd or leg  swelling, presyncope, palpitations, heartburn, abdominal pain, anorexia, nausea, vomiting, diarrhea  or change in bowel or urinary habits, change in stools or urine, dysuria,hematuria,  rash, arthralgias, visual complaints, headache, numbness weakness or ataxia or problems with walking or coordination,  change in mood/affect or memory.               Objective:   Physical Exam   W/c bound chronically ill appearing  wm with cerebellar pattern dysarthria   Wt Readings from Last 3 Encounters:  12/22/12 220 lb (99.791 kg)        HEENT: nl dentition, turbinates, and orophanx. Nl external ear canals without cough reflex   NECK :  without JVD/Nodes/TM/ nl carotid upstrokes bilaterally   LUNGS: no acc muscle use, mild bilateral  insp and exp rhonchi, no true wheeze, no rub   CV:  RRR  no s3 or murmur or increase in P2, no edema   ABD:  soft and nontender with nl excursion.No bruits or organomegaly, bowel sounds nl  MS:  warm without deformities, calf tenderness, cyanosis or clubbing / extensor nodules  SKIN: warm and dry without lesions    NEURO:  alert, approp severe cerebellar ataxia.        cxr 12/19/12 [facility] xray report (films not available)  Stable L Pl effusion vs scar, new parenchymal changes/ and or thickening R base Assessment:

## 2013-02-06 NOTE — Assessment & Plan Note (Signed)
Probable aspiration pna RLL have not excluded malignancy in smoker but nothing to do for now  Would repeat plan cxr at [facility] as can't stand for one here

## 2013-02-06 NOTE — Assessment & Plan Note (Signed)
>   3 min  Pt states he only smoked for a day.   What he really means is "smoke 4 a day"  Discussed even this puts him at risk of  Worsening airways control and lung ca

## 2013-02-11 ENCOUNTER — Non-Acute Institutional Stay (SKILLED_NURSING_FACILITY): Payer: PRIVATE HEALTH INSURANCE | Admitting: Internal Medicine

## 2013-02-11 DIAGNOSIS — J189 Pneumonia, unspecified organism: Secondary | ICD-10-CM

## 2013-02-11 DIAGNOSIS — G7080 Lambert-Eaton syndrome, unspecified: Secondary | ICD-10-CM

## 2013-02-11 NOTE — Progress Notes (Signed)
          Patient ID: Justin Clements, male   DOB: June 07, 1945, 67 y.o.   MRN: [id number] Facility; [facility]. Cc; monthly [facility] visit for August.   His; patient has been in the building since 2005. As far as I can tell. Major disability is that of [employer] Syndrome. He does not have an associated malignancy. His illness has been very gradually progressive over time. I have episodically wondered about the accuracy of his diagnoses, however, in discussion with him many years ago did not show that he wanted neurologic followup. He also has rheumatoid arthritis and progressive cerebellar ataxia. He was treated for a pneumonia. This month with some suggestion of aspiration, for which he has been seen by speech therapy.  Fees study was passed  Patient tells me that he had a chest x-ray which showed a mass in his right lung. I don't see these results. He is also supposed to have had pulmonary function tests although I am uncertain whether these were actually done out of the facility or simply bedside spirometry in the faciltiy.  He had acute left wrist pain last month and I gave him a course of steroids he says everything is fine currently   Past medical history; #1 coronary artery disease. #2 rheumatoid arthritis. #3 left hip fracture. #4 hyperlipidemia. #5 type 2 diabetes. #6 BPH. #7 progressive cerebellar ataxia/eaton lambert syndrome;   #8 successfully treated for pneumonia.   #9 continued tobacco abuse.  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 Respiratory shallow air entry bilaterally. No crackles or wheezes. Cardiac heart sounds are normal. No  murmurs. Musculoskeletal rheumatoid nodules, there is significant tenderness in his left wrist which is somewhat tender especially on the ventral aspect. Impression/plan #1 successfully treated for a pneumonia last month. He apparently had a followup chest x-ray although I don't see these results. He tells me he has a CT scan of the lung to be arranged. #2 progressive neurologic deficit. Deficits with cerebellar ataxia, cerebellar speech, he is still able to ambulate with a walker with assistance, he does this a pretty much daily with restorative.  #3 Pincus Badder syndrome. He has tolerated this remarkably well over the years. #4 rheumatoid arthritis; I am really uncertain about whether the left wrist is related to this or not he is already on Celebrex 200 mg a day as well as plaquanil. I will give him a course of prednisone.

## 2013-02-12 ENCOUNTER — Ambulatory Visit: Payer: MEDICARE | Admitting: Internal Medicine

## 2013-02-12 ENCOUNTER — Other Ambulatory Visit (HOSPITAL_BASED_OUTPATIENT_CLINIC_OR_DEPARTMENT_OTHER): Payer: Self-pay | Admitting: Internal Medicine

## 2013-02-12 DIAGNOSIS — R918 Other nonspecific abnormal finding of lung field: Secondary | ICD-10-CM

## 2013-02-14 ENCOUNTER — Encounter: Payer: Self-pay | Admitting: Internal Medicine

## 2013-02-14 ENCOUNTER — Telehealth: Payer: Self-pay | Admitting: Internal Medicine

## 2013-02-14 NOTE — Telephone Encounter (Signed)
I spoke with Winona Legato NP. She is faxing over the CT results to triage bc she is wanting MW to review this and give her a call at the # listed above. She is faxing results to triage. Will await results.

## 2013-02-14 NOTE — Telephone Encounter (Signed)
Discussed > rec IR CT bx

## 2013-02-14 NOTE — Telephone Encounter (Signed)
Results giving to [facility]

## 2013-02-15 ENCOUNTER — Ambulatory Visit (HOSPITAL_COMMUNITY): Admission: RE | Admit: 2013-02-15 | Payer: Medicaid Other | Source: Ambulatory Visit

## 2013-02-16 ENCOUNTER — Non-Acute Institutional Stay (SKILLED_NURSING_FACILITY): Payer: PRIVATE HEALTH INSURANCE | Admitting: Internal Medicine

## 2013-02-16 DIAGNOSIS — J449 Chronic obstructive pulmonary disease, unspecified: Secondary | ICD-10-CM

## 2013-02-16 DIAGNOSIS — J4489 Other specified chronic obstructive pulmonary disease: Secondary | ICD-10-CM

## 2013-02-16 DIAGNOSIS — C3430 Malignant neoplasm of lower lobe, unspecified bronchus or lung: Secondary | ICD-10-CM

## 2013-02-16 NOTE — Progress Notes (Signed)
Patient ID: Justin Clements, male   DOB: 1946-01-29, 67 y.o.   MRN: [id number] Facility [facility] Chief complaint; for followup of the right lower lobe peripheral spiculated lung mass History; Mr. Ben is a gentleman with diagnosed Eaton-Lambert syndrome who is been in this facility for many years. He also has seropositive rheumatoid arthritis. He was treated for a right lower lobe pneumonia so months ago recent x-rays have suggested the possibility of an infiltrate in the right lower lobe which has not cleared up. He has a long-standing a known left pleural effusion possibly due to rheumatoid. In view of the new right lower lobe changes which were very worrisome for malignancy he went for a CT scan of the chest the. The reasons that are not totally clear this was done at not thought on [employer]. This showed a dominant 4.7 cm rounded spiculated mass in the right lower lobe. The left-sided effusion and multiple nodular densities within the right lung base the largest measuring 1.4 cm per there is associated partial collapse of the left lower lobe. There was no associated Hilar or mediastinal or hilar lymph nodes. The patient is a continued smoker. He probably has underlying COPD and is recently seen [place] pulmonology [Dr. Wert}. He may also have a rheumatoid changes in his lung.    Physical examination Respiratory; shallow air entry bilaterally but no crackles or wheezes are noted. There is no accessory muscle use. Cardiac heart sounds are normal there is no evidence of congestive heart failure.  Impression/plan #1 right lower lobe lung mass. This is peripherally based and is no doubt malignant. I spoken to the patient about this he wants to proceed with a biopsy although he is not totally clear on whether he would actually agree to proceed to surgery. I will arrange a biopsy of this area in interventional radiology at [facility]. He will undoubtedly need pulmonary function tests as well. He  will not be an ideal surgical candidate due to due his neurologic disability however at this point I sense no reticence from the patient to proceed with an aggressive approach to this mass

## 2013-02-21 ENCOUNTER — Other Ambulatory Visit: Payer: Self-pay | Admitting: Internal Medicine

## 2013-02-21 ENCOUNTER — Encounter: Payer: Self-pay | Admitting: Internal Medicine

## 2013-02-21 DIAGNOSIS — R918 Other nonspecific abnormal finding of lung field: Secondary | ICD-10-CM

## 2013-02-26 ENCOUNTER — Telehealth: Payer: Self-pay | Admitting: Internal Medicine

## 2013-02-26 NOTE — Telephone Encounter (Signed)
Ideally will need pfts' at some point but not needed before bx

## 2013-02-26 NOTE — Telephone Encounter (Signed)
I spoke with [facility]. She is wanting to know if pt should or need to have a PFT done prior to his BX. Pt has PET scan scheduled next week. Please advise if you think pt needs PFT MW thanks

## 2013-02-26 NOTE — Telephone Encounter (Signed)
i called and made Justin Clements aware

## 2013-03-03 ENCOUNTER — Non-Acute Institutional Stay (SKILLED_NURSING_FACILITY): Payer: PRIVATE HEALTH INSURANCE | Admitting: Internal Medicine

## 2013-03-03 DIAGNOSIS — J189 Pneumonia, unspecified organism: Secondary | ICD-10-CM

## 2013-03-03 DIAGNOSIS — C3430 Malignant neoplasm of lower lobe, unspecified bronchus or lung: Secondary | ICD-10-CM

## 2013-03-03 NOTE — Progress Notes (Signed)
          Patient ID: Justin Clements, male   DOB: 09-25-1945, 67 y.o.   MRN: [id number] Facility; [facility]. Cc; monthly [facility] visit for  Sept His; patient has been in the building since 2005. As far as I can tell. Major disability is that of [employer] Syndrome. He does not have an associated malignancy. His illness has been very gradually progressive over time. I have episodically wondered about the accuracy of his diagnoses, however, in discussion with him many years ago did not show that he wanted neurologic followup. He also has rheumatoid arthritis and progressive cerebellar ataxia. He was treated for a pneumonia. This month with some suggestion of aspiration, for which he has been seen by speech therapy.  Fees study was passed  A CT scan of the chest was done at [facility]. The CT scan showed a mass in the right lower lobe. This was not associated with any mediastinal or hilar adenopathy. I spoke with the interventional radiologist at [facility] he had suggested a PET scan which I gather is going to be done this week prior to the actual CT scan guided needle biopsy. Sometime after this he'll need pulmonary function tests and assuming he still a candidate for a surgical option, and assuming the patient wants to pursue this he'll need a thoracic surgery consult. I would like him to see Dr. Sharee Pimple and have pulmonary function tests so before this consultation, again assuming that everything points to is in an aggressive direction   Past medical history; #1 coronary artery disease. #2 rheumatoid arthritis. #3 left hip fracture. #4 hyperlipidemia. #5 type 2 diabetes. #6 BPH. #7 progressive cerebellar ataxia/eaton lambert syndrome;   #8 successfully treated for pneumonia.   #9 continued tobacco abuse.  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 Respiratory shallow air entry bilaterally. No crackles or wheezes. Cardiac heart sounds are normal. No murmurs. Musculoskeletal rheumatoid nodules, there is significant tenderness in his left wrist which is somewhat tender especially on the ventral aspect. Impression/plan #1 right lower lobe lung mass. Again he is going for a PET scan.  Assuming this does not show metastatic disease he will need a CT scan guided needle biopsy. After this review by Dr. Sherene Sires would be in order likely pulmonary function tests as well  #2 progressive neurologic deficit. Deficits with cerebellar ataxia, cerebellar speech, he is still able to ambulate with a walker with assistance, he does this a pretty much daily with restorative.  #3 Pincus Badder syndrome. He has tolerated this remarkably well over the years. #4 rheumatoid arthritis; I am really uncertain about whether the left wrist is related to this or not he is already on Celebrex 200 mg a day as well as plaquanil. I will give him a course of prednisone.

## 2013-03-08 ENCOUNTER — Encounter (HOSPITAL_COMMUNITY)
Admission: RE | Admit: 2013-03-08 | Discharge: 2013-03-08 | Disposition: A | Discharge status: Home / Self Care | Payer: Medicare Other | Source: Ambulatory Visit | Attending: Internal Medicine | Admitting: Internal Medicine

## 2013-03-08 ENCOUNTER — Other Ambulatory Visit: Payer: Self-pay | Admitting: Internal Medicine

## 2013-03-08 ENCOUNTER — Encounter: Payer: Self-pay | Admitting: Internal Medicine

## 2013-03-08 DIAGNOSIS — J90 Pleural effusion, not elsewhere classified: Secondary | ICD-10-CM | POA: Insufficient documentation

## 2013-03-08 DIAGNOSIS — R918 Other nonspecific abnormal finding of lung field: Secondary | ICD-10-CM

## 2013-03-08 DIAGNOSIS — R222 Localized swelling, mass and lump, trunk: Secondary | ICD-10-CM | POA: Insufficient documentation

## 2013-03-08 LAB — GLUCOSE, CAPILLARY: Glucose-Capillary: 131 mg/dL — ABNORMAL HIGH (ref 70–99)

## 2013-03-08 MED ORDER — FLUDEOXYGLUCOSE F - 18 (FDG) INJECTION
18.8000 | Freq: Once | INTRAVENOUS | Status: AC | PRN
  Administered 2013-03-08: 18.8 via INTRAVENOUS

## 2013-03-09 ENCOUNTER — Other Ambulatory Visit: Payer: Self-pay | Admitting: Internal Medicine

## 2013-03-09 DIAGNOSIS — R918 Other nonspecific abnormal finding of lung field: Secondary | ICD-10-CM

## 2013-03-12 ENCOUNTER — Other Ambulatory Visit: Payer: Self-pay | Admitting: Internal Medicine

## 2013-03-12 ENCOUNTER — Ambulatory Visit (HOSPITAL_COMMUNITY)
Admission: RE | Admit: 2013-03-12 | Discharge: 2013-03-12 | Disposition: A | Discharge status: Home / Self Care | Payer: Medicare Other | Source: Ambulatory Visit | Attending: Internal Medicine | Admitting: Internal Medicine

## 2013-03-12 ENCOUNTER — Other Ambulatory Visit (HOSPITAL_COMMUNITY): Payer: Medicare Other

## 2013-03-12 DIAGNOSIS — R918 Other nonspecific abnormal finding of lung field: Secondary | ICD-10-CM

## 2013-03-12 DIAGNOSIS — J90 Pleural effusion, not elsewhere classified: Secondary | ICD-10-CM | POA: Insufficient documentation

## 2013-03-12 NOTE — Progress Notes (Signed)
Patient ID: Justin Clements, male   DOB: [birth year], 67 y.o.   MRN: [id number] Pt presented to [place] dept today for right thoracentesis. On limited US of post rt chest there is only a trace amount of pleural effusion noted ,not amenable to safely tap. Images were reviewed by Dr. Lowella Dandy. Procedure was cancelled. Findings d/w Dr. Sherene Sires . Pt/wife informed.

## 2013-03-13 ENCOUNTER — Telehealth: Payer: Self-pay | Admitting: Internal Medicine

## 2013-03-13 ENCOUNTER — Encounter: Payer: Self-pay | Admitting: Internal Medicine

## 2013-03-13 DIAGNOSIS — R918 Other nonspecific abnormal finding of lung field: Secondary | ICD-10-CM

## 2013-03-13 NOTE — Telephone Encounter (Signed)
I spoke with [facility]. She is requesting an update of what is going on. She was told by the pt they cancelled his DX that was scheduled on Monday and this will be d/w MW. She is aware he is not in today. Please advise Dr. Sherene Sires thanks

## 2013-03-13 NOTE — Telephone Encounter (Signed)
The easiest procedure that was suggested by the PET was a thoracentesis but under ultrasound guidance, the standard way to do it, the fluid was too small and difficult to reach so IR rec a CT bx and this was ordered

## 2013-03-13 NOTE — Telephone Encounter (Signed)
LMTCB for Merideth Abbey, NP at [facility].

## 2013-03-14 ENCOUNTER — Telehealth: Payer: Self-pay | Admitting: Internal Medicine

## 2013-03-14 NOTE — Telephone Encounter (Signed)
Order placed. Please advise PCC's thanks 

## 2013-03-14 NOTE — Telephone Encounter (Signed)
Go ahead and order CT bx if not already done

## 2013-03-14 NOTE — Telephone Encounter (Signed)
Justin Clements is aware of recs. She wanted to know when the CT bx would be set up. There is no order in EPIC from what I can tell. Was IR going to order this or [place]? Please advise MW thanks

## 2013-03-14 NOTE — Telephone Encounter (Signed)
This has been put in review by IR Tobe Sos

## 2013-03-14 NOTE — Telephone Encounter (Signed)
I advised her we are still awaiting for this to be scheduled. We advised the NP Markita this AM we would call once it is done. Will sign off this message as it is a duplicate message

## 2013-03-15 ENCOUNTER — Telehealth: Payer: Self-pay | Admitting: Internal Medicine

## 2013-03-15 NOTE — Telephone Encounter (Signed)
Called and spoke with pts wife and she is aware that this is in review by IR.  She is aware that we will call her once this has been scheduled.  Pt voiced her understanding.

## 2013-03-19 NOTE — Progress Notes (Signed)
Quick Note:  See PN dated 10/30 ______

## 2013-03-20 ENCOUNTER — Other Ambulatory Visit: Payer: Self-pay | Admitting: Radiology

## 2013-03-21 ENCOUNTER — Ambulatory Visit (HOSPITAL_COMMUNITY)
Admission: RE | Admit: 2013-03-21 | Discharge: 2013-03-21 | Disposition: A | Discharge status: Home / Self Care | Payer: Medicare Other | Source: Ambulatory Visit | Attending: Internal Medicine | Admitting: Internal Medicine

## 2013-03-21 ENCOUNTER — Encounter: Payer: Self-pay | Admitting: Internal Medicine

## 2013-03-21 ENCOUNTER — Ambulatory Visit (HOSPITAL_COMMUNITY): Admission: RE | Admit: 2013-03-21 | Payer: Medicare Other | Source: Ambulatory Visit

## 2013-03-21 ENCOUNTER — Encounter (HOSPITAL_COMMUNITY): Payer: Self-pay

## 2013-03-21 DIAGNOSIS — R222 Localized swelling, mass and lump, trunk: Secondary | ICD-10-CM | POA: Insufficient documentation

## 2013-03-21 DIAGNOSIS — G7080 Lambert-Eaton syndrome, unspecified: Secondary | ICD-10-CM | POA: Insufficient documentation

## 2013-03-21 DIAGNOSIS — N400 Enlarged prostate without lower urinary tract symptoms: Secondary | ICD-10-CM | POA: Insufficient documentation

## 2013-03-21 DIAGNOSIS — R918 Other nonspecific abnormal finding of lung field: Secondary | ICD-10-CM

## 2013-03-21 DIAGNOSIS — M069 Rheumatoid arthritis, unspecified: Secondary | ICD-10-CM | POA: Insufficient documentation

## 2013-03-21 DIAGNOSIS — E785 Hyperlipidemia, unspecified: Secondary | ICD-10-CM | POA: Insufficient documentation

## 2013-03-21 DIAGNOSIS — R911 Solitary pulmonary nodule: Secondary | ICD-10-CM | POA: Insufficient documentation

## 2013-03-21 DIAGNOSIS — D381 Neoplasm of uncertain behavior of trachea, bronchus and lung: Secondary | ICD-10-CM | POA: Insufficient documentation

## 2013-03-21 HISTORY — DX: Hyperlipidemia, unspecified: E78.5

## 2013-03-21 HISTORY — DX: Type 2 diabetes mellitus without complications: E11.9

## 2013-03-21 HISTORY — DX: Rheumatoid arthritis, unspecified: M06.9

## 2013-03-21 HISTORY — DX: Atherosclerotic heart disease of native coronary artery without angina pectoris: I25.10

## 2013-03-21 HISTORY — DX: Lambert-Eaton syndrome, unspecified: G70.80

## 2013-03-21 HISTORY — DX: Benign prostatic hyperplasia without lower urinary tract symptoms: N40.0

## 2013-03-21 LAB — GLUCOSE, CAPILLARY

## 2013-03-21 LAB — CBC
HCT: 38.6 % — ABNORMAL LOW (ref 39.0–52.0)
Hemoglobin: 12.9 g/dL — ABNORMAL LOW (ref 13.0–17.0)
MCH: 27.3 pg (ref 26.0–34.0)
MCHC: 33.4 g/dL (ref 30.0–36.0)
MCV: 81.6 fL (ref 78.0–100.0)
RDW: 14.2 % (ref 11.5–15.5)

## 2013-03-21 MED ORDER — MIDAZOLAM HCL 2 MG/2ML IJ SOLN
INTRAMUSCULAR | Status: AC
  Filled 2013-03-21: qty 4

## 2013-03-21 MED ORDER — FENTANYL CITRATE 0.05 MG/ML IJ SOLN
INTRAMUSCULAR | Status: AC | PRN
  Administered 2013-03-21: 25 ug via INTRAVENOUS
  Administered 2013-03-21 (×2): 12.5 ug via INTRAVENOUS
  Administered 2013-03-21 (×2): 25 ug via INTRAVENOUS

## 2013-03-21 MED ORDER — FENTANYL CITRATE 0.05 MG/ML IJ SOLN
INTRAMUSCULAR | Status: AC
  Filled 2013-03-21: qty 4

## 2013-03-21 MED ORDER — MIDAZOLAM HCL 2 MG/2ML IJ SOLN
INTRAMUSCULAR | Status: AC | PRN
  Administered 2013-03-21: .25 mg via INTRAVENOUS
  Administered 2013-03-21: 1 mg via INTRAVENOUS
  Administered 2013-03-21: .25 mg via INTRAVENOUS
  Administered 2013-03-21: 0.5 mg via INTRAVENOUS

## 2013-03-21 MED ORDER — LIDOCAINE-EPINEPHRINE 1 %-1:100000 IJ SOLN
INTRAMUSCULAR | Status: AC
  Filled 2013-03-21: qty 1

## 2013-03-21 MED ORDER — SODIUM CHLORIDE 0.9 % IV SOLN: Freq: Once | INTRAVENOUS | Status: DC

## 2013-03-21 NOTE — H&P (Signed)
Justin Clements is an 67 y.o. male.   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 Coronary artery disease   . Diabetes mellitus without complication   . Rheumatoid arthritis   . Hyperlipidemia   . BPH (benign prostatic hyperplasia)   . Eaton-Lambert syndrome     progressive cerbellar ataxia    History reviewed. No pertinent past surgical history.  History reviewed. No pertinent family history. Social History:  reports that he has been smoking Cigarettes.  He has a 12.5 pack-year smoking history. He has never used smokeless tobacco. He reports that he does not drink alcohol or use illicit drugs.  Allergies:  Allergies  Allergen Reactions  . Primaxin [Imipenem]      (Not in a hospital admission)  Results for orders placed during the hospital encounter of 03/21/13 (from the past 48 hour(s))  APTT     Status: None   Collection Time    03/21/13  9:32 AM      Result Value Range   aPTT 29  24 - 37 seconds  CBC     Status: Abnormal   Collection Time    03/21/13  9:32 AM      Result Value Range   WBC 8.3  4.0 - 10.5 K/uL   RBC 4.73  4.22 - 5.81 MIL/uL   Hemoglobin 12.9 (*) 13.0 - 17.0 g/dL   HCT 84.1 (*) 32.4 - 40.1 %   MCV 81.6  78.0 - 100.0 fL   MCH 27.3  26.0 - 34.0 pg   MCHC 33.4  30.0 - 36.0 g/dL   RDW 02.7  25.3 - 66.4 %   Platelets 458 (*) 150 - 400 K/uL  PROTIME-INR     Status: None   Collection Time    03/21/13  9:32 AM      Result Value Range   Prothrombin Time 13.2  11.6 - 15.2 seconds   INR 1.02  0.00 - 1.49   No results found.  Review of Systems  Constitutional: Negative for fever and weight loss.  Respiratory: Positive for cough. Negative for sputum production.   Gastrointestinal:  Negative for nausea and vomiting.  Neurological: Positive for tremors and weakness. Negative for headaches.  Psychiatric/Behavioral: Positive for substance abuse.       Smoker    Blood pressure 126/87, pulse 86, temperature 98 F (36.7 C), resp. rate 18, height 5\' 10"  (1.778 m), weight 217 lb (98.431 kg), SpO2 97.00%. Physical Exam  Constitutional: He is oriented to person, place, and time. He appears well-developed.  Cardiovascular: Normal rate, regular rhythm and normal heart sounds.   No murmur heard. Respiratory: Effort normal and breath sounds normal. He has no wheezes.  GI: Soft. Bowel sounds are normal. There is no tenderness.  Musculoskeletal:  Moves all 4s- uses wc always Pincus Badder syndrome- progressive cerebellar ataxia  Neurological: He is alert and oriented to person, place, and time. Coordination abnormal.  Skin: Skin is warm and dry.  Psychiatric: He has a normal mood and affect. His behavior is normal. Judgment and thought content normal.     Assessment/Plan Pincus Badder syndrome- lives in facility Cough x several days Work up shows R lung mass; +PET Now scheduled for lung mass biopsy Pt and wife  aware of procedure benefits and risks and agreeable to proceed Consent signed and in chart  Zhanae Proffit A 03/21/2013, 10:20 AM

## 2013-03-21 NOTE — Procedures (Signed)
Technically successful CT guided biopsy of peripherally hypermetabolic mass primarily within the right middle lobe.  Approximately 20 cc of purulent fluid aspirated from the mass following the biopsy. No immediate post procedural complications.

## 2013-03-24 LAB — CULTURE, ROUTINE-ABSCESS

## 2013-03-26 LAB — ANAEROBIC CULTURE

## 2013-03-28 ENCOUNTER — Telehealth: Payer: Self-pay | Admitting: Internal Medicine

## 2013-03-28 NOTE — Telephone Encounter (Signed)
Pt is requesting results from biopsy on 03-21-13. Please advise. Carron Curie, CMA

## 2013-03-28 NOTE — Telephone Encounter (Signed)
Studies are c/w chronic aspiration - rx augmentin 875 twice daily x 10 days then ov to regroup

## 2013-03-29 ENCOUNTER — Telehealth: Payer: Self-pay | Admitting: Internal Medicine

## 2013-03-29 NOTE — Telephone Encounter (Signed)
Spoke with the pt's spouse and notified of recs per MW She verbalized understanding and denied any questions  I called [facility] at [phone] and spoke with [place]  Rx was faxed to the number given and placed in MW scan folder

## 2013-03-29 NOTE — Telephone Encounter (Signed)
Spoke with Tonja and advised augmentin x 10 days to tx respiratory infection Order refaxed  Nothing further needed

## 2013-04-04 ENCOUNTER — Telehealth: Payer: Self-pay | Admitting: Internal Medicine

## 2013-04-04 NOTE — Telephone Encounter (Signed)
I spoke with [place]. Pt NP at the facility is requesting pt BX results to be faxed to them. I advised will do so. Nothing further needed

## 2013-04-07 ENCOUNTER — Non-Acute Institutional Stay (SKILLED_NURSING_FACILITY): Payer: PRIVATE HEALTH INSURANCE | Admitting: Internal Medicine

## 2013-04-07 DIAGNOSIS — J852 Abscess of lung without pneumonia: Secondary | ICD-10-CM

## 2013-04-07 DIAGNOSIS — G7080 Lambert-Eaton syndrome, unspecified: Secondary | ICD-10-CM

## 2013-04-07 NOTE — Progress Notes (Signed)
Patient ID: Justin Clements, male   DOB: Oct 10, 1945, 67 y.o.   MRN: [id number]           Patient ID: Justin Clements, male   DOB: 1946-01-06, 67 y.o.   MRN: [id number] Facility; [facility]. Cc; monthly [facility] visit for  Sept His; patient has been in the building since 2005. As far as I can tell. Major disability is that of [employer] Syndrome. He does not have an associated malignancy. His illness has been very gradually progressive over time. I have episodically wondered about the accuracy of his diagnoses, however, in discussion with him many years ago did not show that he wanted neurologic followup. He also has rheumatoid arthritis and progressive cerebellar ataxia. He was treated for a pneumonia. This month with some suggestion of aspiration, for which he has been seen by speech therapy.  Fees study was passed  A CT scan of the chest was done at [facility]. The CT scan showed a mass in the right lower lobe. This was not associated with any mediastinal or hilar adenopathy. A PET scan was subsequently done with the results shown below. Has gone for a CT scan needle biopsy which was an essentially an acellular specimen. This is now labeled as an abscess. Extending from the right lower and right middle. Acid-fast stain is negative although culture is pending of course, no yeast or fungal elements are seen Gram stain showed abundant gram-positive cocci and cultured moderate microaerophilic strep. No anaerobes were isolated  and used for attenuation correction and anatomic localization only. (This was not acquired as a diagnostic CT examination.) Additional exam technical data entered on technologist worksheet.   COMPARISON:  [facility] CT chest dated 02/13/2013. Prior PET-CT dated 01/17/2004.   FINDINGS: NECK   No hypermetabolic lymph nodes in the neck.   CHEST   5.3 x 4.0 cm spiculated mass in the lateral right middle lobe (series 2/ image 29), centrally necrotic, max SUV 7.5 (PET  image 98). This is compatible with primary bronchogenic neoplasm.   Associated trace right pleural effusion with associated hypermetabolism, max SUV 6.0 (PET image 110), worrisome for malignant pleural effusion.   Chronic, thick-walled small left pleural effusion with overlying rounded atelectasis in the left upper and lower lobes. No associated hypermetabolism.   1.7 cm right infrahilar/ lower lobe node (series 2/image 92), max SUV 5.4 (PET image 91), suspicious for nodal metastasis.   Additional scattered small bilateral supraclavicular and axillary nodes with mild hypermetabolism, max SUV 2.6 on the left (PET image 57) and 2.9 on the right (PET image 84), similar to 2005 and likely reactive.   ABDOMEN/PELVIS   No abnormal hypermetabolic activity within the liver, pancreas, adrenal glands, or spleen.   No hypermetabolic lymph nodes in the abdomen or pelvis.   Nonspecific diffuse GI uptake, without focal abnormality on CT, likely physiologic.   SKELETON   No focal hypermetabolic activity to suggest osseous metastasis.   Focal hypermetabolism within the posteromedial left chest wall musculature (serratus anterior), max SUV 6.6 (PET image 83). Vaguely increased soft tissue density may be present in this region on CT (series 2/ image 84). Although unusual, intramuscular metastasis is not excluded.   IMPRESSION: 5.3 cm spiculated mass in the lateral right middle lobe, max SUV 7.5, compatible with primary bronchogenic neoplasm.   Associated trace right pleural effusion, suspicious for malignancy, max SUV 6.0.   Associated 1.7 cm short axis right infrahilar/ lower lobe node, max SUV 5.4, suspicious for nodal metastasis.  Focal hypermetabolism within the posteromedial left serratus anterior muscle, max SUV 6.6. Although unusual, intramuscular metastasis is not excluded.   Additional scattered bilateral supraclavicular and axillary nodes with mild hypermetabolism,  similar to 2005, likely reactive.   Chronic left pleural effusion with associated rounded atelectasis in the left upper and lower lobes, non FDG avid, likely benig    Past medical history; #1 coronary artery disease. #2 rheumatoid arthritis. #3 left hip fracture. #4 hyperlipidemia. #5 type 2 diabetes. #6 BPH. #7 progressive cerebellar ataxia/eaton lambert syndrome;   #8 successfully treated for pneumonia.   #9 continued tobacco abuse.  Medication list is reviewed per nursing home [facility]   Social history; he is a DO NOT RESUSCITATE  Review of systems; Respiratory no cough no shortness of breath Cardiac no chest pain GI no abdominal pain Musculoskeletal; no joint pain this month  Physical examination O2 sat is 94% on room air, pulse rate 77, respiratory rate 20, blood pressure 119/77. Respiratory shallow air entry bilaterally. No crackles or wheezes. Cardiac heart sounds are normal. No murmurs. Musculoskeletal rheumatoid nodules, there is significant tenderness in his left wrist which is somewhat tender especially on the ventral aspect. Impression/plan #1 right lower/right middle lobe lung mass. Fortunately I suppose we could read the biopsy is negative however it was acellular. Culture of this mcroaerophilic strep and the patient should be treated. Microaerophilic strep is almost always penicillin sensitive therefore I think Dr. Sharee Clements prescribed Augmentin for 10 day however the patient is saying that he has a very itchy rash on his arms therefore probably give him another week of Flagyl. I wonder whether This man should have a followup CT scan in 6 months, I am not completely reassured by this specimen that malignancy is absent. #2 progressive neurologic deficit. Deficits with cerebellar ataxia, cerebellar speech, he is still able to ambulate with a walker with assistance, he does this a pretty much daily with restorative.  #3 Pincus Badder syndrome. He has tolerated this remarkably well  over the years. #4 rheumatoid arthritis recent flare in his left wrist was treated successfully with Celebrex

## 2013-04-11 ENCOUNTER — Telehealth: Payer: Self-pay | Admitting: Internal Medicine

## 2013-04-11 NOTE — Telephone Encounter (Signed)
Pt had CT guided Bx 03/21/13-- [facility] is wanting to know when patient will be returning to our office to follow up with MW.  Dr Sherene Sires, when do you want patient to f/u?

## 2013-04-11 NOTE — Telephone Encounter (Signed)
Scheduled for appt with MW 04/18/13 at 2pm Aware that cxr needed prior to visit--arrive 15-24mins early to have done--aware to check in first with our office.

## 2013-04-11 NOTE — Telephone Encounter (Signed)
ATC but was on hold x 10 min and no one ever came to the phone [facility]

## 2013-04-11 NOTE — Telephone Encounter (Signed)
Next week with cxr is fine

## 2013-04-18 ENCOUNTER — Ambulatory Visit (INDEPENDENT_AMBULATORY_CARE_PROVIDER_SITE_OTHER): Payer: Medicare Other | Admitting: Internal Medicine

## 2013-04-18 ENCOUNTER — Other Ambulatory Visit: Payer: Self-pay | Admitting: Internal Medicine

## 2013-04-18 ENCOUNTER — Encounter: Payer: Self-pay | Admitting: Internal Medicine

## 2013-04-18 ENCOUNTER — Ambulatory Visit (INDEPENDENT_AMBULATORY_CARE_PROVIDER_SITE_OTHER)
Admission: RE | Admit: 2013-04-18 | Discharge: 2013-04-18 | Disposition: A | Discharge status: Home / Self Care | Payer: Medicare Other | Source: Ambulatory Visit | Attending: Internal Medicine | Admitting: Internal Medicine

## 2013-04-18 VITALS — BP 122/60 | HR 70 | Temp 98.5°F

## 2013-04-18 DIAGNOSIS — R918 Other nonspecific abnormal finding of lung field: Secondary | ICD-10-CM

## 2013-04-18 DIAGNOSIS — F17200 Nicotine dependence, unspecified, uncomplicated: Secondary | ICD-10-CM

## 2013-04-18 LAB — FUNGUS CULTURE W SMEAR: Fungal Smear: NONE SEEN

## 2013-04-18 NOTE — Patient Instructions (Signed)
You had a classic lung abscess with bacteria typical of a source from your teeth so need to keep up with all your dental care  No further antibiotics or pulmonary follow up required - follow up as needed

## 2013-04-19 NOTE — Progress Notes (Signed)
Subjective:     Patient ID: Justin Clements, male   DOB: 1946-03-17   MRN: [id number]  Brief patient profile:  44 yowm  Smoker NHP 2005 dx 2002 with cerebellar degen and RA with ? Lung dz per [facility] referred by NP for Leanord Hawking for abn cxr  History of Present Illness   12/22/2012  1st pulmonary eval / Meilah Delrosario on acei/ smoker cc sev months of cough clear liquid with one episode of hemoptysis x one cup of mucus traces of brpb and some fever. Has not used neb in weeks, does intermittently choke on food, no recent abx.  No classically pleuritic cp or sob at rest. rec You should use the nebulizer up to 4 x daily as needed The key is to stop smoking completely before smoking completely stops you!  Be careful with swallowing    02/05/2013 f/u ov/Clista Rainford still smoking re: hemoptysis gone / swallowing better  Chief Complaint  Patient presents with  . Follow-up    Pt states that his cough is the about the same since the last visit. Has had  no more hemoptysis- only coughs up minimal white sputum, esp at night.    no limiting doe but very inactive rec    - CT chest 02/13/13  Mass in  R Lung base adj to lateral chest wall and pleural thickening > rec IR bx - PET    03/08/2013 >  Assoc with small effusion > 03/21/2013 CT bx > pus MODERATE MICROAEROPHILIC STREPTOCOCCI > rx augmentin x 10 d completed 04/07/13   04/18/2013 f/u ov/Odyssey Vasbinder re:  Chief Complaint  Patient presents with  . Followup with CXR    Pt states that his breathing is doing well. He denies any new co's today.   no cp, no cough, says "teeth all fixed now" No sob though very sedentary/ mostly w/c bound    No obvious day to day or daytime variabilty or assoc  Cp, subjective wheeze overt sinus or hb symptoms. No unusual exp hx or h/o childhood pna/ asthma or knowledge of premature birth.    Sleeping ok without nocturnal  or early am exacerbation  of respiratory  c/o's or need for noct saba. Also denies any obvious fluctuation of symptoms with weather  or environmental changes or other aggravating or alleviating factors except as outlined above   Current Medications, Allergies, Past Medical History, Past Surgical History, Family History, and Social History were reviewed in [employer] record.  ROS  The following are not active complaints unless bolded sore throat, dysphagia, dental problems, itching, sneezing,  nasal congestion or excess/ purulent secretions, ear ache,   fever, chills, sweats, unintended wt loss, pleuritic or exertional cp, hemoptysis,  orthopnea pnd or leg swelling, presyncope, palpitations, heartburn, abdominal pain, anorexia, nausea, vomiting, diarrhea  or change in bowel or urinary habits, change in stools or urine, dysuria,hematuria,  rash, arthralgias, visual complaints, headache, numbness weakness or ataxia or problems with walking or coordination,  change in mood/affect or memory.               Objective:   Physical Exam   W/c bound chronically ill appearing  wm with cerebellar pattern dysarthria   Wt Readings from Last 3 Encounters:  12/22/12 220 lb (99.791 kg)        HEENT: adequate dentition, turbinates, and orophanx. Nl external ear canals without cough reflex   NECK :  without JVD/Nodes/TM/ nl carotid upstrokes bilaterally   LUNGS: no acc muscle use, clear  to  A and P    CV:  RRR  no s3 or murmur or increase in P2, no edema   ABD:  soft and nontender with nl excursion.No bruits or organomegaly, bowel sounds nl  MS:  warm without deformities, calf tenderness, cyanosis or clubbing / extensor nodules  SKIN: warm and dry without lesions    NEURO:  alert, approp severe cerebellar ataxia.        cxr 12/19/12 [facility] xray report (films not available) Stable L Pl effusion vs scar, new parenchymal changes/ and or thickening R base  cxr 04/18/13 1. The left pleural effusion does not appear to have significantly  increased since the post thoracentesis film on March 21, 2013.  2. The pulmonary interstitial markings are slightly less conspicuous  today especially on the right which may reflect ongoing resolution  of interstitial edema or infiltrates.     Assessment:

## 2013-04-19 NOTE — Assessment & Plan Note (Addendum)
-   CT chest 02/13/13  Mass in  R Lung base adj to lateral chest wall and pleural thickening > rec IR bx - IR 02/21/2013 > rec PET first 03/08/2013 >  Assoc with small effusion > 03/21/2013 CT bx > pus MODERATE MICROAEROPHILIC STREPTOCOCCI > rx augmentin x 10 day completed 04/07/13 > improved significantly so no more abx for now  Comment: Source of bacteria almost certainly is almost certainly from teeth and needs ongoing attention to optimize dental hygiene  There is a small L effusion as well but has not progressed and can follow prn symptoms

## 2013-04-20 NOTE — Assessment & Plan Note (Signed)
>   3 min  Strongly advised to quit - do not feel he has any significant copd or evidence of lung ca but he is putting himself at risk.

## 2013-04-23 ENCOUNTER — Other Ambulatory Visit: Payer: Self-pay | Admitting: *Deleted

## 2013-04-23 MED ORDER — HYDROCODONE-ACETAMINOPHEN 5-325 MG PO TABS: ORAL_TABLET | ORAL | Status: DC

## 2013-05-03 LAB — AFB CULTURE WITH SMEAR (NOT AT ARMC)

## 2013-05-04 ENCOUNTER — Non-Acute Institutional Stay (SKILLED_NURSING_FACILITY): Payer: PRIVATE HEALTH INSURANCE | Admitting: Internal Medicine

## 2013-05-04 DIAGNOSIS — G7080 Lambert-Eaton syndrome, unspecified: Secondary | ICD-10-CM

## 2013-05-04 DIAGNOSIS — J852 Abscess of lung without pneumonia: Secondary | ICD-10-CM

## 2013-05-04 DIAGNOSIS — M069 Rheumatoid arthritis, unspecified: Secondary | ICD-10-CM

## 2013-05-04 NOTE — Progress Notes (Signed)
Patient ID: Justin Clements, male   DOB: Sep 21, 1945, 67 y.o.   MRN: [id number] Facility; [facility]. Cc; monthly [facility] visit for  November His; patient has been in the building since 2005. As far as I can tell. Major disability is that of [employer] Syndrome. He does not have an associated malignancy. His illness has been very gradually progressive over time. I have episodically wondered about the accuracy of his diagnoses, however, in discussion with him many years ago did not show that he wanted neurologic followup. He also has rheumatoid arthritis and progressive cerebellar ataxia. He was treated for a pneumonia. This month with some suggestion of aspiration, for which he has been seen by speech therapy.  Fees study was passed  A CT scan of the chest was done at [facility]. The CT scan showed a mass in the right lower lobe. This was not associated with any mediastinal or hilar adenopathy. A PET scan was subsequently done with the results shown below. Has gone for a CT scan needle biopsy which was an essentially an acellular specimen. This is now labeled as an abscess. Extending from the right lower and right middle. Acid-fast stain is negative although culture is pending of course, no yeast or fungal elements are seen Gram stain showed abundant gram-positive cocci and cultured moderate microaerophilic strep. No anaerobes were isolated. He was treated with augmenting and then subsequently clindamycin.  and used for attenuation correction and anatomic localization only. (This was not acquired as a diagnostic CT examination.) Additional exam technical data entered on technologist worksheet.   COMPARISON:  [facility] CT chest dated 02/13/2013. Prior PET-CT dated 01/17/2004.   FINDINGS: NECK   No hypermetabolic lymph nodes in the neck.   CHEST   5.3 x 4.0 cm spiculated mass in the lateral right middle lobe (series 2/ image 29), centrally necrotic, max SUV 7.5 (PET image 98). This is  compatible with primary bronchogenic neoplasm.   Associated trace right pleural effusion with associated hypermetabolism, max SUV 6.0 (PET image 110), worrisome for malignant pleural effusion.   Chronic, thick-walled small left pleural effusion with overlying rounded atelectasis in the left upper and lower lobes. No associated hypermetabolism.   1.7 cm right infrahilar/ lower lobe node (series 2/image 92), max SUV 5.4 (PET image 91), suspicious for nodal metastasis.   Additional scattered small bilateral supraclavicular and axillary nodes with mild hypermetabolism, max SUV 2.6 on the left (PET image 57) and 2.9 on the right (PET image 84), similar to 2005 and likely reactive.   ABDOMEN/PELVIS   No abnormal hypermetabolic activity within the liver, pancreas, adrenal glands, or spleen.   No hypermetabolic lymph nodes in the abdomen or pelvis.   Nonspecific diffuse GI uptake, without focal abnormality on CT, likely physiologic.   SKELETON   No focal hypermetabolic activity to suggest osseous metastasis.   Focal hypermetabolism within the posteromedial left chest wall musculature (serratus anterior), max SUV 6.6 (PET image 83). Vaguely increased soft tissue density may be present in this region on CT (series 2/ image 84). Although unusual, intramuscular metastasis is not excluded.   IMPRESSION: 5.3 cm spiculated mass in the lateral right middle lobe, max SUV 7.5, compatible with primary bronchogenic neoplasm.   Associated trace right pleural effusion, suspicious for malignancy, max SUV 6.0.   Associated 1.7 cm short axis right infrahilar/ lower lobe node, max SUV 5.4, suspicious for nodal metastasis.   Focal hypermetabolism within the posteromedial left serratus anterior muscle, max  SUV 6.6. Although unusual, intramuscular metastasis is not excluded.   Additional scattered bilateral supraclavicular and axillary nodes with mild hypermetabolism, similar to 2005, likely  reactive.   Chronic left pleural effusion with associated rounded atelectasis in the left upper and lower lobes, non FDG avid, likely benig    Past medical history; #1 coronary artery disease. #2 rheumatoid arthritis. #3 left hip fracture. #4 hyperlipidemia. #5 type 2 diabetes. #6 BPH. #7 progressive cerebellar ataxia/eaton lambert syndrome;   #8 successfully treated for pneumonia.   #9 continued tobacco abuse.  Medication list is reviewed per nursing home [facility]   Social history; he is a DO NOT RESUSCITATE  Review of systems; Respiratory no cough no shortness of breath Cardiac no chest pain GI no abdominal pain Musculoskeletal; He complains of pain in his bilateral knees and shoulders.   Physical Exam: Respiratory shallow air entry bilaterally. No crackles or wheezes. Cardiac heart sounds are normal. No murmurs. Musculoskeletal rheumatoid nodules, there is significant tenderness in his left wrist which is somewhat tender especially on the ventral aspect. Impression/plan #1 right lower/right middle lobe lung mass. Fortunately I suppose we could read the biopsy is negative however it was acellular. Culture of this mcroaerophilic strep and the patient was treated. A cxr on 12/e did not show an obvious RLL nodule however PA films do not show the RML. His AFB and Fungal cultures were negative.  #2 progressive neurologic deficit. Deficits with cerebellar ataxia, cerebellar speech, he is still able to ambulate with a walker with assistance, he does this a pretty much daily with restorative.  #3 Pincus Badder syndrome. He has tolerated this remarkably well over the years. #4 rheumatoid arthritis: This appears to be much more active.

## 2013-06-16 ENCOUNTER — Non-Acute Institutional Stay (SKILLED_NURSING_FACILITY): Payer: PRIVATE HEALTH INSURANCE | Admitting: Internal Medicine

## 2013-06-16 DIAGNOSIS — G7080 Lambert-Eaton syndrome, unspecified: Secondary | ICD-10-CM

## 2013-06-16 DIAGNOSIS — M069 Rheumatoid arthritis, unspecified: Secondary | ICD-10-CM

## 2013-06-16 NOTE — Progress Notes (Signed)
Patient ID: Justin Clements, male   DOB: [birth year], 68 y.o.   MRN: [id number]        Facility; [facility]. Cc; monthly [facility] visit for December His; patient has been in the building since 2005. As far as I can tell. Major disability is that of [employer] Syndrome. He does not have an associated malignancy. His illness has been very gradually progressive over time. I have episodically wondered about the accuracy of his diagnoses, however, in discussion with him many years ago did not show that he wanted neurologic followup. He also has rheumatoid arthritis and progressive cerebellar ataxia. He was treated for a pneumonia. This month with some suggestion of aspiration, for which he has been seen by speech therapy.  Fees study was passed  A CT scan of the chest was done at [facility]. The CT scan showed a mass in the right lower lobe. This was not associated with any mediastinal or hilar adenopathy. A PET scan was subsequently done with the results shown below. Has gone for a CT scan needle biopsy which was an essentially an acellular specimen. This is now labeled as an abscess. Extending from the right lower and right middle. Acid-fast stain is negative although culture is pending of course, no yeast or fungal elements are seen Gram stain showed abundant gram-positive cocci and cultured moderate microaerophilic strep. No anaerobes were isolated. He was treated with augmenting and then subsequently clindamycin.  and used for attenuation correction and anatomic localization only. (This was not acquired as a diagnostic CT examination.) Additional exam technical data entered on technologist worksheet.   COMPARISON:  [facility] CT chest dated 02/13/2013. Prior PET-CT dated 01/17/2004.   FINDINGS: NECK   No hypermetabolic lymph nodes in the neck.   CHEST   5.3 x 4.0 cm spiculated mass in the lateral right middle lobe (series 2/ image 29), centrally necrotic, max SUV 7.5 (PET image 98). This is  compatible with primary bronchogenic neoplasm.   Associated trace right pleural effusion with associated hypermetabolism, max SUV 6.0 (PET image 110), worrisome for malignant pleural effusion.   Chronic, thick-walled small left pleural effusion with overlying rounded atelectasis in the left upper and lower lobes. No associated hypermetabolism.   1.7 cm right infrahilar/ lower lobe node (series 2/image 92), max SUV 5.4 (PET image 91), suspicious for nodal metastasis.   Additional scattered small bilateral supraclavicular and axillary nodes with mild hypermetabolism, max SUV 2.6 on the left (PET image 57) and 2.9 on the right (PET image 84), similar to 2005 and likely reactive.   ABDOMEN/PELVIS   No abnormal hypermetabolic activity within the liver, pancreas, adrenal glands, or spleen.   No hypermetabolic lymph nodes in the abdomen or pelvis.   Nonspecific diffuse GI uptake, without focal abnormality on CT, likely physiologic.   SKELETON   No focal hypermetabolic activity to suggest osseous metastasis.   Focal hypermetabolism within the posteromedial left chest wall musculature (serratus anterior), max SUV 6.6 (PET image 83). Vaguely increased soft tissue density may be present in this region on CT (series 2/ image 84). Although unusual, intramuscular metastasis is not excluded.   IMPRESSION: 5.3 cm spiculated mass in the lateral right middle lobe, max SUV 7.5, compatible with primary bronchogenic neoplasm.   Associated trace right pleural effusion, suspicious for malignancy, max SUV 6.0.   Associated 1.7 cm short axis right infrahilar/ lower lobe node, max SUV 5.4, suspicious for nodal metastasis.   Focal hypermetabolism within the posteromedial left serratus anterior muscle,  max SUV 6.6. Although unusual, intramuscular metastasis is not excluded.   Additional scattered bilateral supraclavicular and axillary nodes with mild hypermetabolism, similar to 2005, likely  reactive.   Chronic left pleural effusion with associated rounded atelectasis in the left upper and lower lobes, non FDG avid, likely benig    Past medical history; #1 coronary artery disease. #2 rheumatoid arthritis. #3 left hip fracture. #4 hyperlipidemia. #5 type 2 diabetes. #6 BPH. #7 progressive cerebellar ataxia/eaton lambert syndrome;   #8 successfully treated for pneumonia.   #9 patient is successfully stopped smoking  Medication list is reviewed per nursing home [facility]   Social history; he is a DO NOT RESUSCITATE  Review of systems; Respiratory no cough no shortness of breath Cardiac no chest pain GI no abdominal pain Musculoskeletal; He complains of pain in his bilateral knees and shoulders.   Physical Exam: Temperature 98.9, pulse 68, respirations 16, blood pressure 127/64, weight is 218.8 pounds, O2 sat 98% on room air  Respiratory shallow air entry bilaterally. No crackles or wheezes. Cardiac heart sounds are normal. No murmurs. Musculoskeletal rheumatoid nodules, there is significant tenderness in his left wrist which is somewhat tender especially on the ventral aspect. Impression/plan #1 right lower/right middle lobe lung mass. Fortunately I suppose we could read the biopsy is negative however it was acellular. Culture of this mcroaerophilic strep and the patient was treated. A cxr on 12/e did not show an obvious RLL nodule however PA films do not show the RML. His AFB and Fungal cultures were negative.  #2 progressive neurologic deficit. Deficits with cerebellar ataxia, cerebellar speech, he is still able to ambulate with a walker with assistance, he does this a pretty much daily with restorative.  #3 [name] syndrome. He has tolerated this remarkably well over the years. #4 rheumatoid arthritis: This appears to be much more active. He has no rheumatoid nodules but today at least is not having any pain. I note the prednisone challenge earlier this month

## 2013-07-25 ENCOUNTER — Other Ambulatory Visit (HOSPITAL_BASED_OUTPATIENT_CLINIC_OR_DEPARTMENT_OTHER): Payer: Self-pay | Admitting: Internal Medicine

## 2013-07-27 ENCOUNTER — Ambulatory Visit (HOSPITAL_COMMUNITY)
Admission: RE | Admit: 2013-07-27 | Discharge: 2013-07-27 | Disposition: A | Discharge status: Home / Self Care | Payer: Medicare Other | Source: Ambulatory Visit | Attending: Internal Medicine | Admitting: Internal Medicine

## 2013-07-27 ENCOUNTER — Other Ambulatory Visit (HOSPITAL_BASED_OUTPATIENT_CLINIC_OR_DEPARTMENT_OTHER): Payer: Self-pay | Admitting: Internal Medicine

## 2013-07-27 DIAGNOSIS — R52 Pain, unspecified: Secondary | ICD-10-CM

## 2013-07-27 DIAGNOSIS — Z87891 Personal history of nicotine dependence: Secondary | ICD-10-CM | POA: Insufficient documentation

## 2013-07-27 DIAGNOSIS — J984 Other disorders of lung: Secondary | ICD-10-CM | POA: Insufficient documentation

## 2013-09-10 ENCOUNTER — Non-Acute Institutional Stay (SKILLED_NURSING_FACILITY): Payer: PRIVATE HEALTH INSURANCE | Admitting: Internal Medicine

## 2013-09-10 DIAGNOSIS — J852 Abscess of lung without pneumonia: Secondary | ICD-10-CM

## 2013-09-10 DIAGNOSIS — M069 Rheumatoid arthritis, unspecified: Secondary | ICD-10-CM

## 2013-09-10 DIAGNOSIS — G7080 Lambert-Eaton syndrome, unspecified: Secondary | ICD-10-CM

## 2013-09-10 NOTE — Progress Notes (Signed)
Patient ID: Justin Clements, male   DOB: 1945/10/25, 68 y.o.   MRN: [id number]        Facility; [facility]. Cc; monthly [facility] visit for March His; patient has been in the building since 2005. As far as I can tell. Major disability is that of [employer] Syndrome. He does not have an associated malignancy. His illness has been very gradually progressive over time. I have episodically wondered about the accuracy of his diagnoses, however, in discussion with him many years ago did not show that he wanted neurologic followup. He also has rheumatoid arthritis and progressive cerebellar ataxia. He was treated for a pneumonia. This month with some suggestion of aspiration, for which he has been seen by speech therapy.  Fees study was passed  Late last year a CT scan of his chest showed a right lower lobe mass. He went on to have a aspirate of this area which showed an abscess. He was treated with oral antibiotics and a repeat chest x-ray last month month shows resolution of this area. I don't think any further imaging tests need to be done.   Past medical history; #1 coronary artery disease. #2 rheumatoid arthritis. #3 left hip fracture. #4 hyperlipidemia. #5 type 2 diabetes. #6 BPH. #7 progressive cerebellar ataxia/eaton lambert syndrome;   #8 successfully treated for pneumonia.   #9 patient is successfully stopped smoking  Medication list is reviewed per nursing home [facility]   Social history; he is a DO NOT RESUSCITATE  Review of systems; Respiratory no cough no shortness of breath Cardiac no chest pain GI no abdominal pain Musculoskeletal; He complains of pain in his bilateral knees and shoulders.   Physical Exam: Temperature 98.7-pulse 77-respirations 19-blood pressure 112/65-weight 218 pounds-O2 sat is 95% on room air  Respiratory shallow air entry bilaterally. No crackles or wheezes. Cardiac heart sounds are normal. No murmurs. Musculoskeletal rheumatoid nodules, there is  significant tenderness in his left wrist which is somewhat tender especially on the ventral aspect. Impression/plan #1 right lower/right middle lobe lung mass. We treated this man for culture which showed microaerophilic strep. The area in question is cleared on his chest x-ray I don't think any further neuro imaging needs to be done   #2 progressive neurologic deficit. Deficits with cerebellar ataxia, cerebellar speech, he is still able to ambulate with a walker with assistance, he does this a pretty much daily with restorative.  #3 [name] syndrome. He has tolerated this remarkably well over the years. #4 rheumatoid arthritis: This appears to be much more active. He has no rheumatoid nodules but today at least is not having any pain. I note the prednisone challenge earlier this month. He is on plaqenil and celebrex

## 2013-10-06 ENCOUNTER — Non-Acute Institutional Stay (SKILLED_NURSING_FACILITY): Payer: PRIVATE HEALTH INSURANCE | Admitting: Internal Medicine

## 2013-10-06 DIAGNOSIS — J852 Abscess of lung without pneumonia: Secondary | ICD-10-CM

## 2013-10-06 DIAGNOSIS — M069 Rheumatoid arthritis, unspecified: Secondary | ICD-10-CM

## 2013-10-06 DIAGNOSIS — G7080 Lambert-Eaton syndrome, unspecified: Secondary | ICD-10-CM

## 2013-10-06 NOTE — Progress Notes (Signed)
Patient ID: Justin Clements, male   DOB: [birth year], 68 y.o.   MRN: [id number]         Facility; [facility]. Cc; monthly [facility] visit forApril His; patient has been in the building since 2005. As far as I can tell. Major disability is that of [employer] Syndrome. He does not have an associated malignancy. His illness has been very gradually progressive over time. I have episodically wondered about the accuracy of his diagnoses, however, in discussion with him many years ago did not show that he wanted neurologic followup. He also has rheumatoid arthritis and progressive cerebellar ataxia. He was treated for a pneumonia.   Late last year a CT scan of his chest showed a right lower lobe mass. He went on to have a aspirate of this area which showed an abscess. He was treated with oral antibiotics and a repeat chest x-ray last month month shows resolution of this area. I don't think any further imaging tests need to be done.   Past medical history; #1 coronary artery disease. #2 rheumatoid arthritis. #3 left hip fracture. #4 hyperlipidemia. #5 type 2 diabetes. #6 BPH. #7 progressive cerebellar ataxia/eaton lambert syndrome;   #8 successfully treated for pneumonia.   #9 patient is successfully stopped smoking  Medication list is reviewed per nursing home [facility] Cle[facility]   Social history; he is a DO NOT RESUSCITATE  Review of systems; Respiratory no cough no shortness of breath Cardiac no chest pain GI no abdominal pain Musculoskeletal; He complains of pain in his bilateral knees and shoulders.   Physical Exam: Temperature 98.1-pulse 78-respirations 16-blood pressure 122/62-weight 217.2 pounds-O2 sat is 95% on room air  Respiratory shallow air entry bilaterally. No crackles or wheezes. Cardiac heart sounds are normal. No murmurs. Musculoskeletal rheumatoid nodules, there is significant tenderness in his left wrist which is somewhat tender especially on the ventral  aspect. Impression/plan #1 right lower/right middle lobe lung mass. We treated this man for culture which showed microaerophilic strep. The area in question is cleared on his chest x-ray I don't think any further neuro imaging needs to be done   #2 progressive neurologic deficit. Deficits with cerebellar ataxia, cerebellar speech, he is still able to ambulate with a walker with assistance, he does this a pretty much daily with restorative.  #3 Justin Clements syndrome. He has tolerated this remarkably well over the years. #4 rheumatoid arthritis: This appears to be much more active. He has no rheumatoid nodules but today at least is not having any pain. I note the prednisone challenge earlier this month. He is on plaqenil and celebrex

## 2014-02-15 ENCOUNTER — Non-Acute Institutional Stay (SKILLED_NURSING_FACILITY): Payer: PRIVATE HEALTH INSURANCE | Admitting: Internal Medicine

## 2014-02-15 ENCOUNTER — Encounter: Payer: Self-pay | Admitting: Internal Medicine

## 2014-02-15 DIAGNOSIS — M069 Rheumatoid arthritis, unspecified: Secondary | ICD-10-CM

## 2014-02-15 DIAGNOSIS — J181 Lobar pneumonia, unspecified organism: Secondary | ICD-10-CM

## 2014-02-15 DIAGNOSIS — I10 Essential (primary) hypertension: Secondary | ICD-10-CM

## 2014-02-15 DIAGNOSIS — E1151 Type 2 diabetes mellitus with diabetic peripheral angiopathy without gangrene: Secondary | ICD-10-CM

## 2014-02-15 DIAGNOSIS — J189 Pneumonia, unspecified organism: Secondary | ICD-10-CM | POA: Insufficient documentation

## 2014-02-15 DIAGNOSIS — E1159 Type 2 diabetes mellitus with other circulatory complications: Secondary | ICD-10-CM

## 2014-02-15 DIAGNOSIS — D509 Iron deficiency anemia, unspecified: Secondary | ICD-10-CM

## 2014-02-15 DIAGNOSIS — I110 Hypertensive heart disease with heart failure: Secondary | ICD-10-CM

## 2014-02-15 DIAGNOSIS — D638 Anemia in other chronic diseases classified elsewhere: Secondary | ICD-10-CM | POA: Insufficient documentation

## 2014-02-15 DIAGNOSIS — K219 Gastro-esophageal reflux disease without esophagitis: Secondary | ICD-10-CM | POA: Insufficient documentation

## 2014-02-15 DIAGNOSIS — K529 Noninfective gastroenteritis and colitis, unspecified: Secondary | ICD-10-CM

## 2014-02-15 HISTORY — DX: Anemia in other chronic diseases classified elsewhere: D63.8

## 2014-02-15 HISTORY — DX: Noninfective gastroenteritis and colitis, unspecified: K52.9

## 2014-02-15 HISTORY — DX: Type 2 diabetes mellitus with diabetic peripheral angiopathy without gangrene: E11.51

## 2014-02-15 HISTORY — DX: Hypertensive heart disease with heart failure: I11.0

## 2014-02-15 NOTE — Assessment & Plan Note (Signed)
Pt has been neg for C diff; has been covered with probiotics but that was stopped 2/2 immunosuppressed status; prn immodium with caution

## 2014-02-15 NOTE — Progress Notes (Signed)
MRN: [id number] Name: Justin Clements  Sex: male Age: 68 y.o. DOB: Jan 21, 1946  [place] #: [facility] Facility/Room: 578 Level Of Care: SNF Provider: [name] D Emergency Contacts: Extended Emergency Contact Information Primary Emergency Contact: Clink,Nancy Address: [address]          [place] [address] [place] Phone: [phone] Relation: Spouse  Code Status: DNR  Allergies: Primaxin  Chief Complaint  Patient presents with  . Medical Management of Chronic Issues    HPI: Patient is 68 y.o. male who is being seen for routine problems.  Past Medical History  Diagnosis Date  . Coronary artery disease   . Diabetes mellitus without complication   . Rheumatoid arthritis   . Hyperlipidemia   . BPH (benign prostatic hyperplasia)   . Eaton-Lambert syndrome     progressive cerbellar ataxia  . GERD (gastroesophageal reflux disease)     History reviewed. No pertinent past surgical history.    Medication List       This list is accurate as of: 02/15/14 10:07 PM.  Always use your most recent med list.               CALCIUM 500 PO  Take by mouth. Calcium 500 mg tablet 1 po tid     calcium carbonate 500 MG chewable tablet  Commonly known as:  TUMS - dosed in mg elemental calcium  Chew 1 tablet by mouth 3 (three) times daily.     celecoxib 200 MG capsule  Commonly known as:  CELEBREX  Take 200 mg by mouth daily.     CHLORASEPTIC SORE THROAT 167 MG/5ML Liqd  Generic drug:  Acetaminophen  Take by mouth. Chloraseptic sore throat spray apply spray to throat every 4 hours as needed     acetaminophen 325 MG tablet  Commonly known as:  TYLENOL  Take 650 mg by mouth every 6 (six) hours as needed for pain. 325 mg caplet administer 2 tablets q6h prn mild pain     DUONEB IN  Inhale into the lungs. duoneb 0.5 mg-3mg /2ml soln inhale 1 vial via HFN every 6 hrs as needed for cough or shortness of breath Ipratropium bromide/albuterol  sulfate     HYDROcodone-acetaminophen 5-325 MG per tablet  Commonly known as:  NORCO/VICODIN  Take one tablet by mouth every 6 hours as needed for pain     hydrocortisone butyrate 0.1 % Crea cream  Commonly known as:  LUCOID  Apply 1 application topically daily.     insulin aspart 100 UNIT/ML injection  Commonly known as:  novoLOG  Inject 4 Units into the skin 3 (three) times daily before meals.     KLOR-CON 10 PO  Take by mouth. klor-con 10 meq tab give 1 tab po daily for potassium supplement. Do not crush     LANTUS SOLOSTAR 100 UNIT/ML Solostar Pen  Generic drug:  Insulin Glargine  Inject 64 Units into the skin.     LOPID PO  Take by mouth. Lopid 600 g administer 1 tab po bid     metFORMIN 500 MG tablet  Commonly known as:  GLUCOPHAGE  Give 2 and 1/2 tablet po twice daily     metoprolol succinate 25 MG 24 hr tablet  Commonly known as:  TOPROL-XL  Give 1/2 tab (12.5) po daily. Hold for SBP less than 100 and HR less than 60 (metoprolol succinate)     NITROSTAT 0.4 MG SL tablet  Generic drug:  nitroGLYCERIN  Place 0.4  mg under the tongue every 5 (five) minutes as needed for chest pain.     PERIDEX 0.12 % solution  Generic drug:  chlorhexidine  Use as directed 15 mLs in the mouth or throat 2 (two) times daily. Give 15 ml po swish and spit every night for gingivitis     PLAQUENIL 200 MG tablet  Generic drug:  hydroxychloroquine  Give 1 tablet po twice daily     ranitidine 150 MG capsule  Commonly known as:  ZANTAC  Take 150 mg by mouth 2 (two) times daily.     tamsulosin 0.4 MG Caps capsule  Commonly known as:  FLOMAX  Take by mouth. Administer 1 capsule po at hs for BPH (Tamsulosin HCL)     THERA Tabs  Take by mouth. Give 1 tablet by mouth daily. multivitains therapeutic     torsemide 5 MG tablet  Commonly known as:  DEMADEX  Give 1 and 1/2 tablet 7.5 mg po daily for pleural effusion (torsemide)     VITAMIN D-3 PO  Take by mouth. 50,000 units caps give 1  capsule po every month on 15th. (cholecalciferol)        Meds ordered this encounter  Medications  . hydrocortisone butyrate (LUCOID) 0.1 % CREA cream    Sig: Apply 1 application topically daily.  . calcium carbonate (TUMS - DOSED IN MG ELEMENTAL CALCIUM) 500 MG chewable tablet    Sig: Chew 1 tablet by mouth 3 (three) times daily.  . ranitidine (ZANTAC) 150 MG capsule    Sig: Take 150 mg by mouth 2 (two) times daily.    Immunization History  Administered Date(s) Administered  . Influenza Whole 02/19/2013    History  Substance Use Topics  . Smoking status: Former Smoker -- 0.25 packs/day for 50 years    Types: Cigarettes    Quit date: 03/19/2013  . Smokeless tobacco: Never Used  . Alcohol Use: No    Review of Systems  DATA OBTAINED: from patient; no c/o GENERAL:  no fevers, fatigue, appetite changes SKIN: No itching, rash HEENT: No complaint RESPIRATORY: No cough, wheezing, SOB CARDIAC: No chest pain, palpitations, lower extremity edema  GI: No abdominal pain, No N/V/D or constipation, No heartburn or reflux  GU: No dysuria, frequency or urgency, or incontinence  MUSCULOSKELETAL: No unrelieved bone/joint pain NEUROLOGIC: No headache, dizziness  PSYCHIATRIC: No overt anxiety or sadness  Filed Vitals:   02/15/14 2143  BP: 137/71  Pulse: 83  Temp: 98 F (36.7 C)  Resp: 16    Physical Exam  GENERAL APPEARANCE: Alert, conversant, No acute distress  SKIN: No diaphoresis rash, or wounds HEENT: Unremarkable RESPIRATORY: Breathing is even, unlabored. Lung sounds are clear   CARDIOVASCULAR: Heart RRR no murmurs, rubs or gallops. No peripheral edema  GASTROINTESTINAL: Abdomen is soft, non-tender, not distended w/ normal bowel sounds.  GENITOURINARY: Bladder non tender, not distended  MUSCULOSKELETAL: No abnormal joints or musculature NEUROLOGIC: Cranial nerves 2-12 grossly intact. Moves all extremities PSYCHIATRIC: Mood and affect appropriate to situation, no  behavioral issues  Patient Active Problem List   Diagnosis Date Noted  . Anemia, iron deficiency 02/15/2014  . HTN (hypertension) 02/15/2014  . PNA (pneumonia) 02/15/2014  . Chronic diarrhea 02/15/2014  . DM (diabetes mellitus), type 2 with peripheral vascular complications 86/57/8469  . GERD (gastroesophageal reflux disease)   . Rheumatoid arthritis   . Hyperlipidemia   . BPH (benign prostatic hyperplasia)   . Eaton-Lambert syndrome   . Pulmonary infiltrates 12/22/2012  . COPD  undetermined GOLD status 12/22/2012  . Smoker 12/22/2012        Assessment and Plan  Rheumatoid arthritis Pt on plaquenil and celebrex with prn norco; leflunomide was recently d/c with no exacerbation of sx  PNA (pneumonia) LLL PNA treated end of  August h 2 weeks levaquin due to immunosuppressd status;recovered  Chronic diarrhea Pt has been neg for C diff; has been covered with probiotics but that was stopped 2/2 immunosuppressed status; prn immodium with caution  Anemia, iron deficiency Currently stable with Hb13.7 in 10/2013. Monitor re immunosuppressed  DM (diabetes mellitus), type 2 with peripheral vascular complications Insulin and metformin; no current wounds,wears diabetic shoes    Hennie Duos, MD

## 2014-02-15 NOTE — Assessment & Plan Note (Signed)
LLL PNA treated end of  August h 2 weeks levaquin due to immunosuppressd status;recovered

## 2014-02-15 NOTE — Assessment & Plan Note (Signed)
Insulin and metformin; no current wounds,wears diabetic shoes

## 2014-02-15 NOTE — Assessment & Plan Note (Signed)
Pt on plaquenil and celebrex with prn norco; leflunomide was recently d/c with no exacerbation of sx

## 2014-02-15 NOTE — Assessment & Plan Note (Signed)
Currently stable with Hb13.7 in 10/2013. Monitor re immunosuppressed

## 2014-04-16 LAB — TSH: TSH: 4.18 u[IU]/mL (ref <=5.90)

## 2014-04-16 LAB — HEMOGLOBIN A1C: HEMOGLOBIN A1C: 9 % — AB (ref 4.0–6.0)

## 2014-05-02 ENCOUNTER — Other Ambulatory Visit: Payer: Self-pay | Admitting: Internal Medicine

## 2014-05-02 DIAGNOSIS — Z8701 Personal history of pneumonia (recurrent): Secondary | ICD-10-CM

## 2014-05-06 ENCOUNTER — Ambulatory Visit (HOSPITAL_COMMUNITY)
Admission: RE | Admit: 2014-05-06 | Discharge: 2014-05-06 | Disposition: A | Discharge status: Home / Self Care | Payer: Medicare Other | Source: Ambulatory Visit | Attending: Internal Medicine | Admitting: Internal Medicine

## 2014-05-06 DIAGNOSIS — J189 Pneumonia, unspecified organism: Secondary | ICD-10-CM | POA: Diagnosis present

## 2014-05-06 DIAGNOSIS — J9811 Atelectasis: Secondary | ICD-10-CM | POA: Insufficient documentation

## 2014-05-06 DIAGNOSIS — G7080 Lambert-Eaton syndrome, unspecified: Secondary | ICD-10-CM | POA: Diagnosis present

## 2014-05-06 DIAGNOSIS — I517 Cardiomegaly: Secondary | ICD-10-CM | POA: Insufficient documentation

## 2014-05-06 DIAGNOSIS — I313 Pericardial effusion (noninflammatory): Secondary | ICD-10-CM | POA: Insufficient documentation

## 2014-05-06 DIAGNOSIS — I2510 Atherosclerotic heart disease of native coronary artery without angina pectoris: Secondary | ICD-10-CM | POA: Diagnosis not present

## 2014-05-06 DIAGNOSIS — R918 Other nonspecific abnormal finding of lung field: Secondary | ICD-10-CM | POA: Diagnosis present

## 2014-05-06 DIAGNOSIS — J90 Pleural effusion, not elsewhere classified: Secondary | ICD-10-CM | POA: Insufficient documentation

## 2014-05-06 DIAGNOSIS — Z8701 Personal history of pneumonia (recurrent): Secondary | ICD-10-CM

## 2014-05-06 DIAGNOSIS — I700 Atherosclerosis of aorta: Secondary | ICD-10-CM | POA: Insufficient documentation

## 2014-05-06 MED ORDER — IOHEXOL 300 MG/ML  SOLN
75.0000 mL | Freq: Once | INTRAMUSCULAR | Status: AC | PRN
  Administered 2014-05-06: 75 mL via INTRAVENOUS

## 2014-06-10 ENCOUNTER — Non-Acute Institutional Stay (SKILLED_NURSING_FACILITY): Payer: Medicare Other | Admitting: Internal Medicine

## 2014-06-10 DIAGNOSIS — E785 Hyperlipidemia, unspecified: Secondary | ICD-10-CM

## 2014-06-10 DIAGNOSIS — D638 Anemia in other chronic diseases classified elsewhere: Secondary | ICD-10-CM

## 2014-06-10 DIAGNOSIS — R1312 Dysphagia, oropharyngeal phase: Secondary | ICD-10-CM

## 2014-06-10 DIAGNOSIS — G7080 Lambert-Eaton syndrome, unspecified: Secondary | ICD-10-CM

## 2014-06-10 DIAGNOSIS — K219 Gastro-esophageal reflux disease without esophagitis: Secondary | ICD-10-CM

## 2014-06-10 DIAGNOSIS — I10 Essential (primary) hypertension: Secondary | ICD-10-CM

## 2014-06-10 DIAGNOSIS — J449 Chronic obstructive pulmonary disease, unspecified: Secondary | ICD-10-CM

## 2014-06-10 DIAGNOSIS — N400 Enlarged prostate without lower urinary tract symptoms: Secondary | ICD-10-CM

## 2014-06-10 NOTE — Progress Notes (Signed)
MRN: [id number] Name: Justin Clements  Sex: male Age: 69 y.o. DOB: 1946/01/28  [place] #: [facility] Facility/Room: 973 Level Of Care: SNF Provider: [name] D Emergency Contacts: Extended Emergency Contact Information Primary Emergency Contact: Gotts,Nancy Address: [address]          [place] [address] [place] Phone: [phone] Relation: Spouse  Code Status: FULL - this represents a change  Allergies: Primaxin  Chief Complaint  Patient presents with  . Medical Management of Chronic Issues    HPI: Patient is 69 y.o. male who is being seen for routine issues.  Past Medical History  Diagnosis Date  . Coronary artery disease   . Diabetes mellitus without complication   . Rheumatoid arthritis   . Hyperlipidemia   . BPH (benign prostatic hyperplasia)   . Eaton-Lambert syndrome     progressive cerbellar ataxia  . GERD (gastroesophageal reflux disease)     History reviewed. No pertinent past surgical history.        Medication List       This list is accurate as of: 06/10/14 11:59 PM.  Always use your most recent med list.               CALCIUM 500 PO  Take by mouth. Calcium 500 mg tablet 1 po tid     calcium carbonate 500 MG chewable tablet  Commonly known as:  TUMS - dosed in mg elemental calcium  Chew 1 tablet by mouth 3 (three) times daily.     celecoxib 200 MG capsule  Commonly known as:  CELEBREX  Take 200 mg by mouth daily.     CHLORASEPTIC SORE THROAT 167 MG/5ML Liqd  Generic drug:  Acetaminophen  Take by mouth. Chloraseptic sore throat spray apply spray to throat every 4 hours as needed     acetaminophen 325 MG tablet  Commonly known as:  TYLENOL  Take 650 mg by mouth every 6 (six) hours as needed for pain. 325 mg caplet administer 2 tablets q6h prn mild pain     DUONEB IN  Inhale into the lungs. duoneb 0.5 mg-3mg /46ml soln inhale 1 vial via HFN every 6 hrs as needed for cough or shortness of breath  Ipratropium bromide/albuterol sulfate     HYDROcodone-acetaminophen 5-325 MG per tablet  Commonly known as:  NORCO/VICODIN  Take one tablet by mouth every 6 hours as needed for pain     hydrocortisone butyrate 0.1 % Crea cream  Commonly known as:  LUCOID  Apply 1 application topically daily.     insulin aspart 100 UNIT/ML injection  Commonly known as:  novoLOG  Inject 4 Units into the skin 3 (three) times daily before meals.     KLOR-CON 10 PO  Take by mouth. klor-con 10 meq tab give 1 tab po daily for potassium supplement. Do not crush     LANTUS SOLOSTAR 100 UNIT/ML Solostar Pen  Generic drug:  Insulin Glargine  Inject 64 Units into the skin.     LOPID PO  Take by mouth. Lopid 600 g administer 1 tab po bid     metFORMIN 500 MG tablet  Commonly known as:  GLUCOPHAGE  Give 2 and 1/2 tablet po twice daily     metoprolol succinate 25 MG 24 hr tablet  Commonly known as:  TOPROL-XL  Give 1/2 tab (12.5) po daily. Hold for SBP less than 100 and HR less than 60 (metoprolol succinate)     NITROSTAT 0.4 MG SL  tablet  Generic drug:  nitroGLYCERIN  Place 0.4 mg under the tongue every 5 (five) minutes as needed for chest pain.     PERIDEX 0.12 % solution  Generic drug:  chlorhexidine  Use as directed 15 mLs in the mouth or throat 2 (two) times daily. Give 15 ml po swish and spit every night for gingivitis     PLAQUENIL 200 MG tablet  Generic drug:  hydroxychloroquine  Give 1 tablet po twice daily     ranitidine 150 MG capsule  Commonly known as:  ZANTAC  Take 150 mg by mouth 2 (two) times daily.     tamsulosin 0.4 MG Caps capsule  Commonly known as:  FLOMAX  Take by mouth. Administer 1 capsule po at hs for BPH (Tamsulosin HCL)     THERA Tabs  Take by mouth. Give 1 tablet by mouth daily. multivitains therapeutic     torsemide 5 MG tablet  Commonly known as:  DEMADEX  Give 1 and 1/2 tablet 7.5 mg po daily for pleural effusion (torsemide)     VITAMIN D-3 PO  Take by  mouth. 50,000 units caps give 1 capsule po every month on 15th. (cholecalciferol)        No orders of the defined types were placed in this encounter.    Immunization History  Administered Date(s) Administered  . Influenza Whole 02/19/2013  . Influenza-Unspecified 02/26/2014    History  Substance Use Topics  . Smoking status: Former Smoker -- 0.25 packs/day for 50 years    Types: Cigarettes    Quit date: 03/19/2013  . Smokeless tobacco: Never Used  . Alcohol Use: No    Review of Systems  DATA OBTAINED: from patient GENERAL:  no fevers, fatigue, appetite changes SKIN: No itching, rash HEENT: No complaint RESPIRATORY: No cough, wheezing, SOB CARDIAC: No chest pain, palpitations, lower extremity edema  GI: No abdominal pain, No N/V/D or constipation, No heartburn or reflux  GU: No dysuria, frequency or urgency, or incontinence  MUSCULOSKELETAL: No unrelieved bone/joint pain NEUROLOGIC: No headache, dizziness  PSYCHIATRIC: No overt anxiety or sadness  Filed Vitals:   06/10/14 1751  BP: 154/68  Pulse: 69  Temp: 96.8 F (36 C)  Resp: 18    Physical Exam  GENERAL APPEARANCE: Alert, conversant, No acute distress ; WM in WC at computer in rec room SKIN: No diaphoresis rash HEENT: Unremarkable RESPIRATORY: Breathing is even, unlabored. Lung sounds are clear   CARDIOVASCULAR: Heart RRR no murmurs, rubs or gallops. No peripheral edema  GASTROINTESTINAL: Abdomen is soft, non-tender, not distended w/ normal bowel sounds.  GENITOURINARY: Bladder non tender, not distended  MUSCULOSKELETAL: RA NEUROLOGIC: Cranial nerves 2-12 grossly intact PSYCHIATRIC: Mood and affect appropriate to situation, no behavioral issues  Patient Active Problem List   Diagnosis Date Noted  . Dysphagia, oropharyngeal 06/22/2014  . Anemia of chronic disease 02/15/2014  . HTN (hypertension) 02/15/2014  . PNA (pneumonia) 02/15/2014  . Chronic diarrhea 02/15/2014  . DM (diabetes mellitus), type 2  with peripheral vascular complications 27/25/3664  . GERD (gastroesophageal reflux disease)   . Rheumatoid arthritis   . Hyperlipidemia   . BPH (benign prostatic hyperplasia)   . Eaton-Lambert syndrome   . Pulmonary infiltrates 12/22/2012  . COPD  undetermined GOLD status 12/22/2012  . 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   BPH (benign prostatic hyperplasia) No signs of obstruction ot UTI's;Plan - conti nue flomax   GERD (gastroesophageal reflux disease) With hx of prior duodenal ulcer;pt on high risk meds-plaquenil and celebrex-;pt on zantac 150 BID without complications   Hyperlipidemia On Lopid 600 mg LDL 73, HDL 25 in 10/2013; FLP due   HTN (hypertension) BP today is high but other BP's show good control;will continue Toprol and cont to monitor    Pt seen 06/05/2014 Hennie Duos, MD

## 2014-06-22 ENCOUNTER — Encounter: Payer: Self-pay | Admitting: Internal Medicine

## 2014-06-22 DIAGNOSIS — R1312 Dysphagia, oropharyngeal phase: Secondary | ICD-10-CM | POA: Insufficient documentation

## 2014-06-22 HISTORY — DX: Dysphagia, oropharyngeal phase: R13.12

## 2014-06-22 NOTE — Assessment & Plan Note (Signed)
Dx with barium swallow; has refused thickened liquids after trying them;he understands fully risk for aspiration PNA; I have signed an exception for him since he does understand;ST to work with him on swallowing

## 2014-06-22 NOTE — Assessment & Plan Note (Signed)
Pt had a bout of acute bronchitis in 12/2015req abx and steroid taper;CXR done ruled out PNA but showed something else and CT chest was rec which pt didn't want at first but has agreed to now

## 2014-06-22 NOTE — Assessment & Plan Note (Signed)
With hx of prior duodenal ulcer;pt on high risk meds-plaquenil and celebrex-;pt on zantac 150 BID without complications

## 2014-06-22 NOTE — Assessment & Plan Note (Signed)
BP today is high but other BP's show good control;will continue Toprol and cont to monitor

## 2014-06-22 NOTE — Assessment & Plan Note (Signed)
No signs of obstruction ot UTI's;Plan - conti nue flomax

## 2014-06-22 NOTE — Assessment & Plan Note (Signed)
Chronic and progressive with ongoing ataxia and new dysphagia - see dysphagia - ; will continue to monitor for new deficits

## 2014-06-22 NOTE — Assessment & Plan Note (Signed)
On Lopid 600 mg LDL 73, HDL 25 in 10/2013; FLP due

## 2014-06-22 NOTE — Assessment & Plan Note (Addendum)
13.7/42.4 in 04/2014, stable.

## 2014-07-16 LAB — LIPID PANEL
Cholesterol: 122 mg/dL (ref 0–200)
HDL: 22 mg/dL — AB (ref 35–70)
LDL CALC: 71 mg/dL
TRIGLYCERIDES: 145 mg/dL (ref 40–160)

## 2014-07-19 ENCOUNTER — Non-Acute Institutional Stay (SKILLED_NURSING_FACILITY): Payer: Medicare Other | Admitting: Internal Medicine

## 2014-07-19 DIAGNOSIS — Z794 Long term (current) use of insulin: Secondary | ICD-10-CM | POA: Diagnosis not present

## 2014-07-19 DIAGNOSIS — M069 Rheumatoid arthritis, unspecified: Secondary | ICD-10-CM | POA: Diagnosis not present

## 2014-07-19 DIAGNOSIS — I509 Heart failure, unspecified: Secondary | ICD-10-CM | POA: Diagnosis not present

## 2014-07-19 DIAGNOSIS — K269 Duodenal ulcer, unspecified as acute or chronic, without hemorrhage or perforation: Secondary | ICD-10-CM

## 2014-07-19 DIAGNOSIS — J90 Pleural effusion, not elsewhere classified: Secondary | ICD-10-CM | POA: Diagnosis not present

## 2014-07-19 DIAGNOSIS — M159 Polyosteoarthritis, unspecified: Secondary | ICD-10-CM

## 2014-07-19 DIAGNOSIS — E119 Type 2 diabetes mellitus without complications: Secondary | ICD-10-CM

## 2014-07-19 DIAGNOSIS — I110 Hypertensive heart disease with heart failure: Secondary | ICD-10-CM

## 2014-07-19 DIAGNOSIS — J309 Allergic rhinitis, unspecified: Secondary | ICD-10-CM | POA: Diagnosis not present

## 2014-07-19 DIAGNOSIS — M150 Primary generalized (osteo)arthritis: Secondary | ICD-10-CM

## 2014-07-19 NOTE — Progress Notes (Signed)
MRN: [id number] Name: Justin Clements  Sex: male Age: 69 y.o. DOB: 12-31-1945  [facility] #: [facility] Facility/Room:422 Level Of Care: SNF Provider: [name] D Emergency Contacts: Extended Emergency Contact Information Primary Emergency Contact: Forinash,Nancy Address: [address]          [place] [address] [place] Phone: [phone] Relation: Spouse  Code Status: DNR  Allergies: Primaxin  Chief Complaint  Patient presents with  . Medical Management of Chronic Issues    HPI: Patient is 69 y.o. male who is being seen for routine issues.  Past Medical History  Diagnosis Date  . Coronary artery disease   . Diabetes mellitus without complication   . Rheumatoid arthritis   . Hyperlipidemia   . BPH (benign prostatic hyperplasia)   . Eaton-Lambert syndrome     progressive cerbellar ataxia  . GERD (gastroesophageal reflux disease)     History reviewed. No pertinent past surgical history.    Medication List       This list is accurate as of: 07/19/14 11:59 PM.  Always use your most recent med list.               CALCIUM 500 PO  Take by mouth. Calcium 500 mg tablet 1 po tid     calcium carbonate 500 MG chewable tablet  Commonly known as:  TUMS - dosed in mg elemental calcium  Chew 1 tablet by mouth 3 (three) times daily.     celecoxib 200 MG capsule  Commonly known as:  CELEBREX  Take 200 mg by mouth daily.     CHLORASEPTIC SORE THROAT 167 MG/5ML Liqd  Generic drug:  Acetaminophen  Take by mouth. Chloraseptic sore throat spray apply spray to throat every 4 hours as needed     acetaminophen 325 MG tablet  Commonly known as:  TYLENOL  Take 650 mg by mouth every 6 (six) hours as needed for pain. 325 mg caplet administer 2 tablets q6h prn mild pain     DUONEB IN  Inhale into the lungs. duoneb 0.5 mg-3mg /14ml soln inhale 1 vial via HFN every 6 hrs as needed for cough or shortness of breath Ipratropium bromide/albuterol sulfate      fluticasone 50 MCG/ACT nasal spray  Commonly known as:  FLONASE  Place 1 spray into both nostrils daily.     HYDROcodone-acetaminophen 5-325 MG per tablet  Commonly known as:  NORCO/VICODIN  Take one tablet by mouth every 6 hours as needed for pain     hydrocortisone butyrate 0.1 % Crea cream  Commonly known as:  LUCOID  Apply 1 application topically daily.     insulin aspart 100 UNIT/ML injection  Commonly known as:  novoLOG  Inject 4 Units into the skin 3 (three) times daily before meals.     KLOR-CON 10 PO  Take by mouth. klor-con 10 meq tab give 1 tab po daily for potassium supplement. Do not crush     LANTUS SOLOSTAR 100 UNIT/ML Solostar Pen  Generic drug:  Insulin Glargine  Inject 64 Units into the skin.     LOPID PO  Take by mouth. Lopid 600 g administer 1 tab po bid     loratadine 10 MG tablet  Commonly known as:  CLARITIN  Take 10 mg by mouth daily.     metFORMIN 500 MG tablet  Commonly known as:  GLUCOPHAGE  Give 2 and 1/2 tablet po twice daily     metoprolol succinate 25 MG 24 hr tablet  Commonly known as:  TOPROL-XL  Give 1/2 tab (12.5) po daily. Hold for SBP less than 100 and HR less than 60 (metoprolol succinate)     NITROSTAT 0.4 MG SL tablet  Generic drug:  nitroGLYCERIN  Place 0.4 mg under the tongue every 5 (five) minutes as needed for chest pain.     PERIDEX 0.12 % solution  Generic drug:  chlorhexidine  Use as directed 15 mLs in the mouth or throat 2 (two) times daily. Give 15 ml po swish and spit every night for gingivitis     PLAQUENIL 200 MG tablet  Generic drug:  hydroxychloroquine  Give 1 tablet po twice daily     ranitidine 150 MG capsule  Commonly known as:  ZANTAC  Take 150 mg by mouth 2 (two) times daily.     tamsulosin 0.4 MG Caps capsule  Commonly known as:  FLOMAX  Take by mouth. Administer 1 capsule po at hs for BPH (Tamsulosin HCL)     THERA Tabs  Take by mouth. Give 1 tablet by mouth daily. multivitains therapeutic      torsemide 5 MG tablet  Commonly known as:  DEMADEX  Give 1 and 1/2 tablet 7.5 mg po daily for pleural effusion (torsemide)     VITAMIN D-3 PO  Take by mouth. 50,000 units caps give 1 capsule po every month on 15th. (cholecalciferol)        Meds ordered this encounter  Medications  . fluticasone (FLONASE) 50 MCG/ACT nasal spray    Sig: Place 1 spray into both nostrils daily.  Marland Kitchen loratadine (CLARITIN) 10 MG tablet    Sig: Take 10 mg by mouth daily.    Immunization History  Administered Date(s) Administered  . Influenza Whole 02/19/2013  . Influenza-Unspecified 02/26/2014    History  Substance Use Topics  . Smoking status: Former Smoker -- 0.25 packs/day for 50 years    Types: Cigarettes    Quit date: 03/19/2013  . Smokeless tobacco: Never Used  . Alcohol Use: No    Review of Systems  DATA OBTAINED: from patient; no c/o , doing well GENERAL:  no fevers, fatigue, appetite changes SKIN: No itching, rash HEENT: No complaint RESPIRATORY: No cough, wheezing, SOB CARDIAC: No chest pain, palpitations, lower extremity edema  GI: No abdominal pain, No N/V/D or constipation, No heartburn or reflux  GU: No dysuria, frequency or urgency, or incontinence  MUSCULOSKELETAL: No unrelieved bone/joint pain NEUROLOGIC: No headache, dizziness  PSYCHIATRIC: No overt anxiety or sadness  Filed Vitals:   07/19/14 1913  BP: 130/87  Pulse: 80  Temp: 97.9 F (36.6 C)  Resp: 20    Physical Exam  GENERAL APPEARANCE: Alert, conversant, No acute distress , WM sitting in WC SKIN: No diaphoresis rash HEENT: Unremarkable RESPIRATORY: Breathing is even, unlabored. Lung sounds are clear   CARDIOVASCULAR: Heart RRR no murmurs, rubs or gallops. No peripheral edema  GASTROINTESTINAL: Abdomen is soft, non-tender, not distended w/ normal bowel sounds.  GENITOURINARY: Bladder non tender, not distended  MUSCULOSKELETAL: rheumatoid nodules NEUROLOGIC: Cranial nerves 2-12 grossly intact; mild  resting tremor, dyscoordination of movement UE 2/2 ataxia PSYCHIATRIC: Mood and affect appropriate to situation, no behavioral issues  Patient Active Problem List   Diagnosis Date Noted  . OA (osteoarthritis) 07/20/2014  . Duodenal ulcer 07/20/2014  . CHF (congestive heart failure) 07/20/2014  . Pleural effusion 07/20/2014  . AR (allergic rhinitis) 07/20/2014  . Insulin use (long-term) in type 2 diabetes 07/20/2014  . Dysphagia, oropharyngeal 06/22/2014  .  Anemia of chronic disease 02/15/2014  . Hypertensive heart disease with CHF (congestive heart failure) 02/15/2014  . PNA (pneumonia) 02/15/2014  . Chronic diarrhea 02/15/2014  . DM (diabetes mellitus), type 2 with peripheral vascular complications 65/68/1275  . GERD (gastroesophageal reflux disease)   . Rheumatoid arthritis   . Hyperlipidemia   . BPH (benign prostatic hyperplasia)   . Eaton-Lambert syndrome   . Pulmonary infiltrates 12/22/2012  . COPD  undetermined GOLD status 12/22/2012  . Smoker 12/22/2012    CBC    Component Value Date/Time   WBC 8.3 03/21/2013 0932   RBC 4.73 03/21/2013 0932   HGB 12.9* 03/21/2013 0932   HCT 38.6* 03/21/2013 0932   PLT 458* 03/21/2013 0932   MCV 81.6 03/21/2013 0932    CMP  No results found for: NA, K, CL, CO2, GLUCOSE, BUN, CREATININE, CALCIUM, PROT, ALBUMIN, AST, ALT, ALKPHOS, BILITOT, GFRNONAA, GFRAA  Assessment and Plan  Rheumatoid arthritis Chronic and stable on plaquenil and celebrex with pain and sx well controlled; Plan- continue with plaquenil and clelbrex   OA (osteoarthritis) Chronic and stable without c/o pain;pt walks with restorative when they are available;uses celebrex and tylenol for pain   Duodenal ulcer Chronic and stable with sx control with currrent regimen of zantac 150 mg BID; Plan continue to monitor for pain, dark stools   Hypertensive heart disease with CHF (congestive heart failure) Bp is at goal today and controlled with metoprolol and  demadex;Plan - cont current meds   CHF (congestive heart failure) Chronic and stable with stable weights;Plan -continue bblocker and demadex   Pleural effusion 2/2 RA; CT chest in 112/2015 showed no changes;pt with no sx;Plan- cont plaquenil for control of RA and monitor for signs and sx   AR (allergic rhinitis) Medications recently changed from allegra to claritin and nasacort to flonase 2/2 formulary changes; recently seen by ENT with dx chronic sinusitis;plan -start new meds and monitor   Insulin use (long-term) in type 2 diabetes Chronic with occ dose titrations;last A1c was 9, not at goal so lantus increased and SSI continued     Hennie Duos, MD

## 2014-07-20 ENCOUNTER — Encounter: Payer: Self-pay | Admitting: Internal Medicine

## 2014-07-20 DIAGNOSIS — I509 Heart failure, unspecified: Secondary | ICD-10-CM | POA: Insufficient documentation

## 2014-07-20 DIAGNOSIS — E119 Type 2 diabetes mellitus without complications: Secondary | ICD-10-CM | POA: Insufficient documentation

## 2014-07-20 DIAGNOSIS — M1990 Unspecified osteoarthritis, unspecified site: Secondary | ICD-10-CM

## 2014-07-20 DIAGNOSIS — J309 Allergic rhinitis, unspecified: Secondary | ICD-10-CM | POA: Insufficient documentation

## 2014-07-20 DIAGNOSIS — J90 Pleural effusion, not elsewhere classified: Secondary | ICD-10-CM | POA: Insufficient documentation

## 2014-07-20 DIAGNOSIS — Z794 Long term (current) use of insulin: Secondary | ICD-10-CM

## 2014-07-20 DIAGNOSIS — K269 Duodenal ulcer, unspecified as acute or chronic, without hemorrhage or perforation: Secondary | ICD-10-CM | POA: Insufficient documentation

## 2014-07-20 HISTORY — DX: Unspecified osteoarthritis, unspecified site: M19.90

## 2014-07-20 NOTE — Assessment & Plan Note (Signed)
Chronic and stable with sx control with currrent regimen of zantac 150 mg BID; Plan continue to monitor for pain, dark stools

## 2014-07-20 NOTE — Assessment & Plan Note (Signed)
Chronic and stable with stable weights;Plan -continue bblocker and demadex

## 2014-07-20 NOTE — Assessment & Plan Note (Signed)
Chronic and stable without c/o pain;pt walks with restorative when they are available;uses celebrex and tylenol for pain

## 2014-07-20 NOTE — Assessment & Plan Note (Signed)
Chronic and stable on plaquenil and celebrex with pain and sx well controlled; Plan- continue with plaquenil and clelbrex

## 2014-07-20 NOTE — Assessment & Plan Note (Signed)
Medications recently changed from allegra to claritin and nasacort to flonase 2/2 formulary changes; recently seen by ENT with dx chronic sinusitis;plan -start new meds and monitor

## 2014-07-20 NOTE — Assessment & Plan Note (Signed)
Chronic with occ dose titrations;last A1c was 9, not at goal so lantus increased and SSI continued

## 2014-07-20 NOTE — Assessment & Plan Note (Signed)
Bp is at goal today and controlled with metoprolol and demadex;Plan - cont current meds

## 2014-07-20 NOTE — Assessment & Plan Note (Signed)
2/2 RA; CT chest in 112/2015 showed no changes;pt with no sx;Plan- cont plaquenil for control of RA and monitor for signs and sx

## 2014-08-16 LAB — CBC AND DIFFERENTIAL
HEMATOCRIT: 40 % — AB (ref 41–53)
Hemoglobin: 12.9 g/dL — AB (ref 13.5–17.5)
PLATELETS: 389 10*3/uL (ref 150–399)
WBC: 4.2 10*3/mL

## 2014-08-16 LAB — HEPATIC FUNCTION PANEL
ALT: 8 U/L — AB (ref 10–40)
AST: 12 U/L — AB (ref 14–40)
Alkaline Phosphatase: 113 U/L (ref 25–125)
BILIRUBIN, TOTAL: 0.4 mg/dL

## 2014-08-16 LAB — BASIC METABOLIC PANEL
BUN: 19 mg/dL (ref 4–21)
Creatinine: 0.6 mg/dL (ref <=1.3)
Glucose: 152 mg/dL
POTASSIUM: 3.9 mmol/L (ref 3.4–5.3)
Sodium: 137 mmol/L (ref 137–147)

## 2014-09-02 IMAGING — CT CT BIOPSY
3 of 11 series · 11 of 32 positions shown, 16 images · non-contrast
Comparison: PET-CT - 03/08/2013;

INDICATION: Peripherally hypermetabolic mass centered primarily within the right
middle lobe also traverses the right major fissure to extend into
the right lower lobe.

EXAM:
CT GUIDED BIOPSY OF RIGHT MIDDLE LOBE MASS

[Series 2: i-spiral 5.0 b40f · axial · 0.96mm/px · z∈[-270,-194]mm · 4 of 38 slices shown, 9 images (1 of 3)]
[im 8/38  soft-tissue]
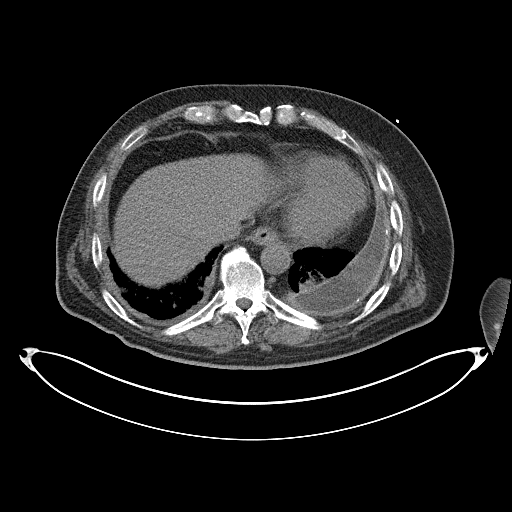
[im 8/38  lung]
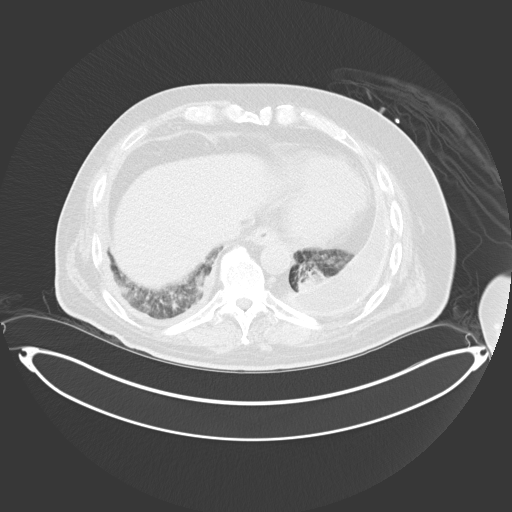
[im 8/38  bone]
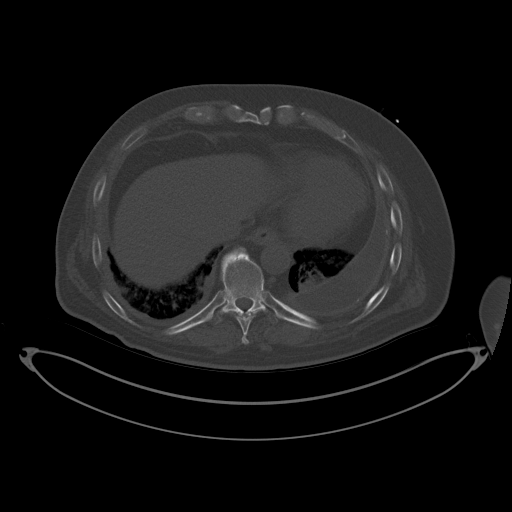
[im 15/38  soft-tissue]
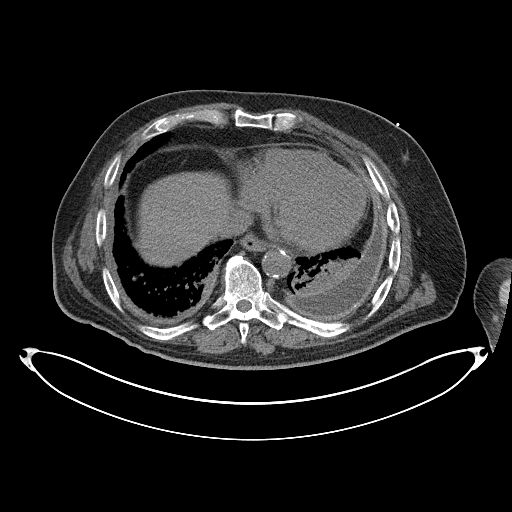
[im 15/38  lung]
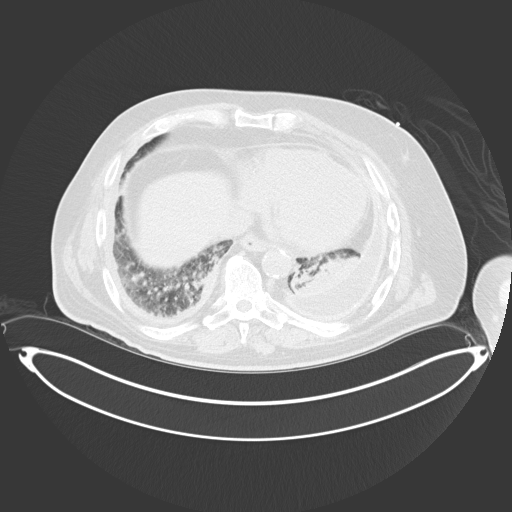
[im 23/38  soft-tissue]
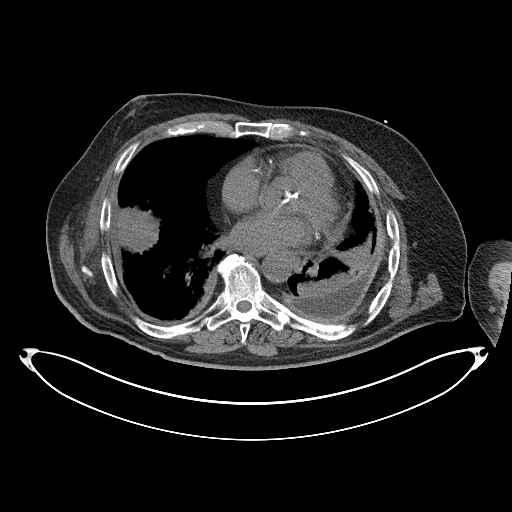
[im 23/38  lung]
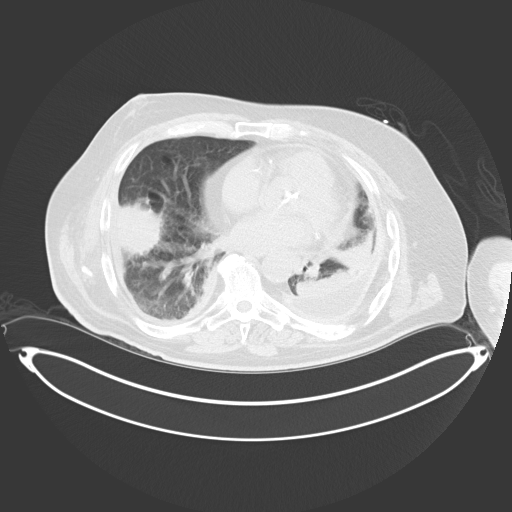
[im 30/38  soft-tissue]
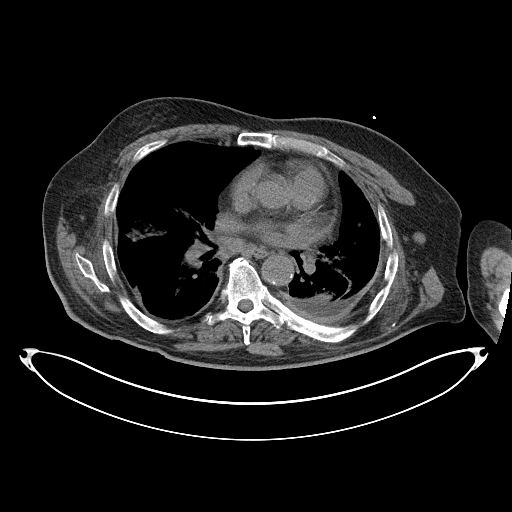
[im 30/38  lung]
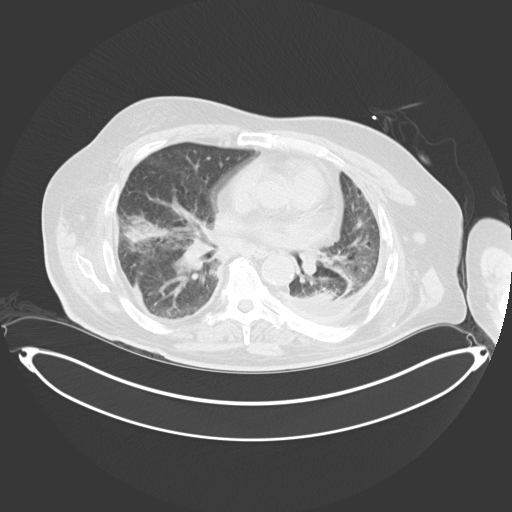

[Series 3: i-spiral 5.0 b40f · axial · 0.96mm/px · z∈[-271,-194]mm · 4 of 38 slices shown (2 of 3)]
[im 8/38  soft-tissue]
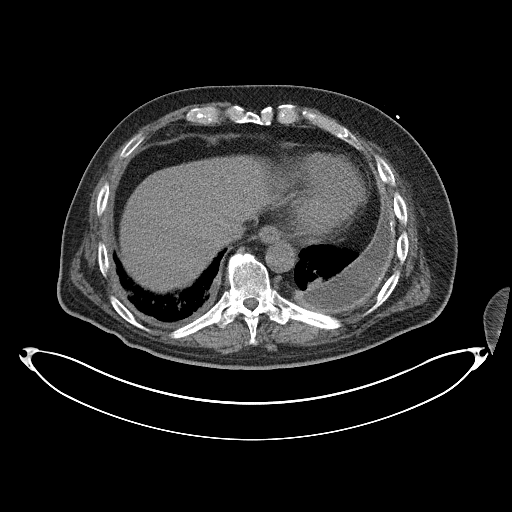
[im 15/38  soft-tissue]
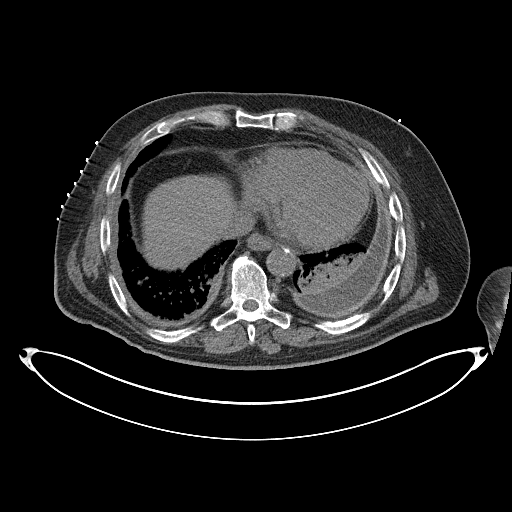
[im 23/38  soft-tissue]
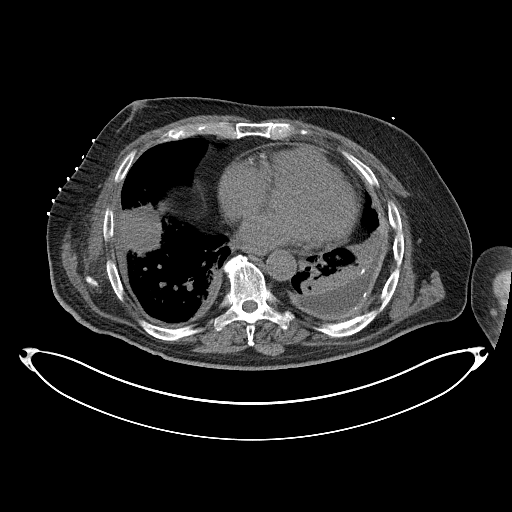
[im 30/38  soft-tissue]
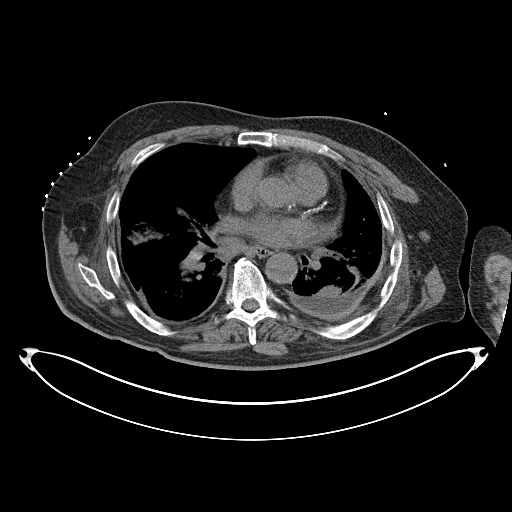

[Series 8: i-spiral 5.0 b40f · axial · 0.96mm/px · z∈[-229,-180]mm · 3 of 29 slices shown (3 of 3)]
[im 8/29  soft-tissue]
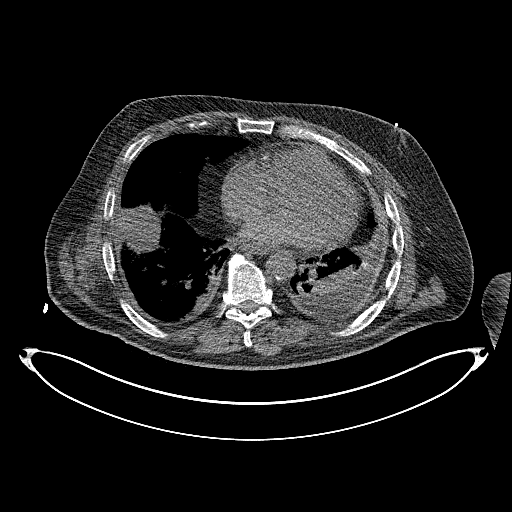
[im 15/29  soft-tissue]
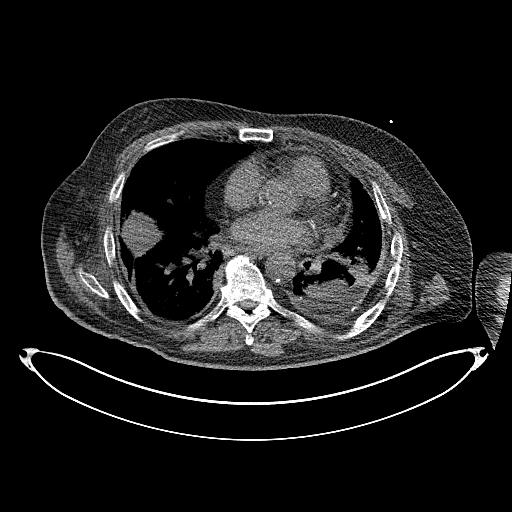
[im 22/29  soft-tissue]
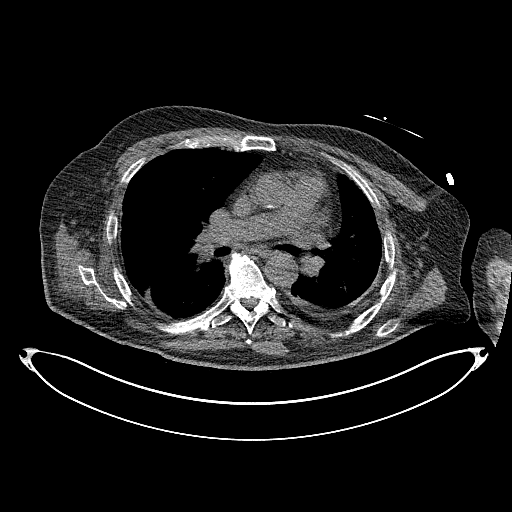

[11 of 32 positions shown; findings below may reference images not displayed]

chest CT - 02/13/2013

MEDICATIONS:
Fentanyl 100 mcg IV; Versed 2mg IV

ANESTHESIA/SEDATION:
Sedation time

35 minutes

CONTRAST:  None

PROCEDURE:
Informed consent was obtained from the patient following an
explanation of the procedure, risks, benefits andalternatives. The
patient understands,agrees and consents for the procedure. All
questions were addressed. A time out was performed prior to the
initiation of the procedure.

The patient was positioned supine on the CT table and a limited
chest CT was performed for procedural planning demonstrating grossly
unchanged appearance of there approximately 4.5 x 4.1 cm mass
centered primarily within the right middle lobe (image 15, series
2). The operative site was prepped and draped in the usual sterile
fashion. Under sterile conditions and local anesthesia, a 17 gauge
coaxial needle was advanced into the peripheral aspect of the
nodule. Positioning was confirmed with intermittent CT fluoroscopy.
This was followed by the acquisition of 3 core needle biopsies with
an 18 gauge core needle biopsy device. As the acquired specimens
appeared purulent, approximately 20 cc of purulent material was
aspirated from the coaxial needle as the needle was retracted. The
aspirated sample was capped and sent to the laboratory for analysis.

Limited post procedural chest CT was negative for pneumothorax or
additional complication. The co-axial needle was removed and
hemostasis was achieved with manual compression. A dressing was
placed. The patient tolerated the procedure well without immediate
postprocedural complication. The patient was escorted to have an
upright chest radiograph.

COMPLICATIONS:
None immediate
IMPRESSION: Technically successful CT guided core needle biopsy of necrotic mass
centered primarily within the right middle lobe. Given the purulent
nature of the acquired specimen, approximately 20 cc of material was
aspirated from the coaxial needle prior to withdrawal. The aspirated
specimen was capped and sent to the laboratory for Gram stain, AFB
and fungal analysis.

## 2014-09-02 IMAGING — CR DG CHEST 1V PORT
1 series · 1 of 1 positions shown · non-contrast
Comparison: 03/21/2013

CLINICAL DATA: Post right lung biopsy.

EXAM:
PORTABLE CHEST - 1 VIEW

[AP]
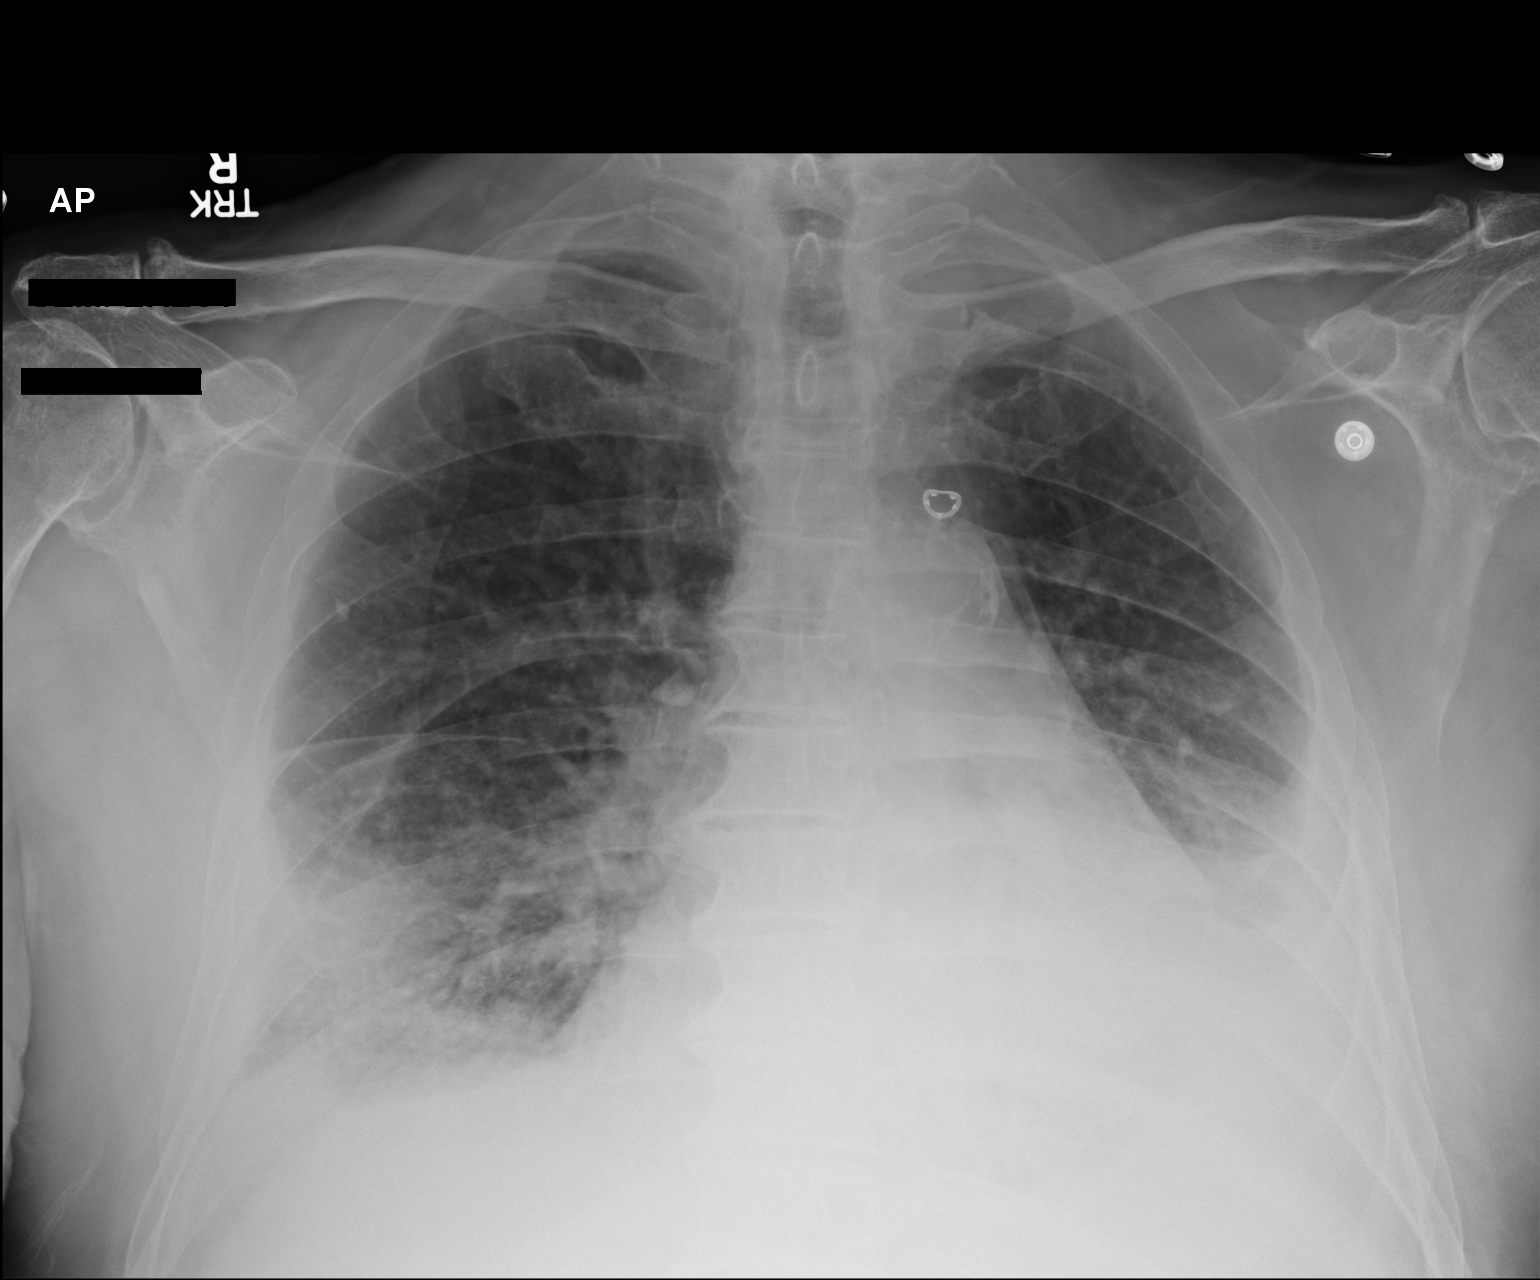

[1 of 1 positions shown; findings below may reference images not displayed]

FINDINGS: Single view of the chest was obtained. Again noted are densities at
the right lung base that corresponds with the known lesion. Stable
densities at left lung base are consistent with volume loss and
pleural fluid. Heart size is upper limits of normal. Negative for
pneumothorax.
IMPRESSION: Negative for pneumothorax after right lung biopsy.

Stable densities in the right lower chest are compatible with the
lesion and parenchymal densities.

Left pleural fluid.

## 2014-09-16 ENCOUNTER — Ambulatory Visit (INDEPENDENT_AMBULATORY_CARE_PROVIDER_SITE_OTHER): Payer: Medicare Other | Admitting: Internal Medicine

## 2014-09-16 ENCOUNTER — Encounter: Payer: Self-pay | Admitting: Internal Medicine

## 2014-09-16 VITALS — BP 113/67 | HR 78 | Ht 70.0 in | Wt 223.0 lb

## 2014-09-16 DIAGNOSIS — Z794 Long term (current) use of insulin: Secondary | ICD-10-CM

## 2014-09-16 DIAGNOSIS — I509 Heart failure, unspecified: Secondary | ICD-10-CM | POA: Diagnosis not present

## 2014-09-16 DIAGNOSIS — J449 Chronic obstructive pulmonary disease, unspecified: Secondary | ICD-10-CM | POA: Diagnosis not present

## 2014-09-16 DIAGNOSIS — G7080 Lambert-Eaton syndrome, unspecified: Secondary | ICD-10-CM

## 2014-09-16 DIAGNOSIS — E119 Type 2 diabetes mellitus without complications: Secondary | ICD-10-CM

## 2014-09-16 DIAGNOSIS — I25119 Atherosclerotic heart disease of native coronary artery with unspecified angina pectoris: Secondary | ICD-10-CM

## 2014-09-16 DIAGNOSIS — E785 Hyperlipidemia, unspecified: Secondary | ICD-10-CM

## 2014-09-16 DIAGNOSIS — M069 Rheumatoid arthritis, unspecified: Secondary | ICD-10-CM

## 2014-09-16 NOTE — Progress Notes (Signed)
OFFICE NOTE  Chief Complaint:  Establish with cardiologist  Primary Care Physician: Hennie Duos, MD  HPI:  Justin Clements is a pleasant 69 year old male who was seen remotely by Dr. Gershon Mussel wall for a acute myocardial infarction in 1991. At that time he underwent cardiac catheterization and may have had balloon angioplasty. No results are available in Epic. It does not seem to be any significant follow-up from a cardiac standpoint after that. He apparently has a history of congestive heart failure, but I could not find any recent assessment of LV function by echo. He's not currently describing any anginal symptoms. Unfortunately he has significant rheumatoid arthritis as well as a history of LEMS, with significant ataxia and difficulty in balance that makes him essentially wheelchair-bound. He is a [place] veteran and apparently had exposure to [employer]. He is also an insulin-dependent diabetic. Therefore has numerous cardiac risk factors. EKG in the office today shows normal sinus rhythm with nonspecific T-wave changes  PMHx:  Past Medical History  Diagnosis Date  . Coronary artery disease   . Diabetes mellitus without complication   . Rheumatoid arthritis   . Hyperlipidemia   . BPH (benign prostatic hyperplasia)   . Eaton-Lambert syndrome     progressive cerbellar ataxia  . GERD (gastroesophageal reflux disease)     No past surgical history on file.  FAMHx:  No family history on file.  SOCHx:   reports that he quit smoking about 17 months ago. His smoking use included Cigarettes. He has a 12.5 pack-year smoking history. He has never used smokeless tobacco. He reports that he does not drink alcohol or use illicit drugs.  ALLERGIES:  Allergies  Allergen Reactions  . Primaxin [Imipenem]     ROS: A comprehensive review of systems was negative except for: Musculoskeletal: positive for arthralgias and myalgias  HOME MEDS: Current Outpatient Prescriptions    Medication Sig Dispense Refill  . Acetaminophen (CHLORASEPTIC SORE THROAT) 167 MG/5ML LIQD Take by mouth. Chloraseptic sore throat spray apply spray to throat every 4 hours as needed    . acetaminophen (TYLENOL) 325 MG tablet Take 650 mg by mouth every 6 (six) hours as needed for pain. 325 mg caplet administer 2 tablets q6h prn mild pain    . calcium carbonate (TUMS - DOSED IN MG ELEMENTAL CALCIUM) 500 MG chewable tablet Chew 1 tablet by mouth 3 (three) times daily.    . Calcium-Magnesium-Vitamin D (CALCIUM 500 PO) Take by mouth. Calcium 500 mg tablet 1 po tid    . celecoxib (CELEBREX) 200 MG capsule Take 200 mg by mouth daily.    . cetirizine (ZYRTEC) 10 MG tablet Take 10 mg by mouth daily.    . chlorhexidine (PERIDEX) 0.12 % solution Use as directed 15 mLs in the mouth or throat 2 (two) times daily. Give 15 ml po swish and spit every night for gingivitis    . Cholecalciferol (VITAMIN D-3 PO) Take by mouth. 50,000 units caps give 1 capsule po every month on 15th. (cholecalciferol)    . fluticasone (FLONASE) 50 MCG/ACT nasal spray Place 1 spray into both nostrils daily.    . Gemfibrozil (LOPID PO) Take by mouth. Lopid 600 g administer 1 tab po bid    . guaifenesin (ROBITUSSIN) 100 MG/5ML syrup Take 200 mg by mouth 3 (three) times daily as needed for cough.    Marland Kitchen HYDROcodone-acetaminophen (NORCO/VICODIN) 5-325 MG per tablet Take one tablet by mouth every 6 hours as needed for pain 120 tablet  0  . hydrocortisone butyrate (LUCOID) 0.1 % CREA cream Apply 1 application topically daily.    . hydroxychloroquine (PLAQUENIL) 200 MG tablet Give 1 tablet po twice daily    . insulin aspart (NOVOLOG) 100 UNIT/ML injection Inject 4 Units into the skin 3 (three) times daily before meals.    . Insulin Glargine (LANTUS SOLOSTAR) 100 UNIT/ML SOPN Inject 33 Units into the skin.     . metFORMIN (GLUCOPHAGE) 500 MG tablet Give 2 and 1/2 tablet po twice daily    . metoprolol tartrate (LOPRESSOR) 25 MG tablet Take 25 mg  by mouth 2 (two) times daily.    . Multiple Vitamin (THERA) TABS Take by mouth. Give 1 tablet by mouth daily. multivitains therapeutic    . nitroGLYCERIN (NITROSTAT) 0.4 MG SL tablet Place 0.4 mg under the tongue every 5 (five) minutes as needed for chest pain.    Marland Kitchen Potassium Chloride (KLOR-CON 10 PO) Take by mouth. klor-con 10 meq tab give 1 tab po daily for potassium supplement. Do not crush    . ranitidine (ZANTAC) 150 MG capsule Take 150 mg by mouth 2 (two) times daily.    . tamsulosin (FLOMAX) 0.4 MG CAPS capsule Take by mouth. Administer 1 capsule po at hs for BPH (Tamsulosin HCL)    . torsemide (DEMADEX) 5 MG tablet Give 1 and 1/2 tablet 7.5 mg po daily for pleural effusion (torsemide)    . Ipratropium-Albuterol (DUONEB IN) Inhale into the lungs. duoneb 0.5 mg-'3mg'$ /62m soln inhale 1 vial via HFN every 6 hrs as needed for cough or shortness of breath Ipratropium bromide/albuterol sulfate     No current facility-administered medications for this visit.    LABS/IMAGING: No results found for this or any previous visit (from the past 48 hour(s)). No results found.  WEIGHTS: Wt Readings from Last 3 Encounters:  09/16/14 223 lb (101.152 kg)  02/15/14 223 lb (101.152 kg)  12/22/12 220 lb (99.791 kg)    VITALS: BP 113/67 mmHg  Pulse 78  Ht '5\' 10"'$  (1.778 m)  Wt 223 lb (101.152 kg)  BMI 32.00 kg/m2  EXAM: General appearance: alert and no distress Neck: no carotid bruit and no JVD Lungs: diminished breath sounds bilaterally Heart: regular rate and rhythm, S1, S2 normal, no murmur, click, rub or gallop Abdomen: soft, non-tender; bowel sounds normal; no masses,  no organomegaly Extremities: extremities normal, atraumatic, no cyanosis or edema and bilateral knee braces Pulses: 2+ and symmetric Skin: Skin color, texture, turgor normal. No rashes or lesions Neurologic: Mental status: Alert, oriented, thought content appropriate Psych: Pleasant  EKG: Normal sinus rhythm at 78,  nonspecific T wave changes  ASSESSMENT: 1. Remote history of MI and angioplasty in 1991 2. Question history of congestive heart failure 3. Rheumatoid arthritis 4. LEMS 5. Insulin-dependent diabetes 6. Ataxia with poor functional capacity  PLAN: 1.   Mr. BMittaghas a remote history of MI in 150and seemingly has had no significant complications since then. He reportedly has a history of congestive heart failure, but I do not see any recent LV function assessment. I would recommend an echocardiogram to further assess LV function. Of course he has numerous risk factors for coronary artery disease but is not describing anginal symptoms. I would not recommend aggressive workup as his functional capacity is quite poor. He is able to stand but has significant ataxia from what has been described to me and requires frequent physical therapy.  Plan to see him back to discuss results of his echo.  Thanks again for the kind referral.  Pixie Casino, MD, [facility] Attending Cardiologist [place] 09/16/2014, 4:08 PM

## 2014-09-16 NOTE — Patient Instructions (Signed)
Your physician has requested that you have an echocardiogram. Echocardiography is a painless test that uses sound waves to create images of your heart. It provides your doctor with information about the size and shape of your heart and how well your heart's chambers and valves are working. This procedure takes approximately one hour. There are no restrictions for this procedure.  Dr.Hilty wants you to follow-up in: 6 months. You will receive a reminder letter in the mail two months in advance. If you don't receive a letter, please call our office to schedule the follow-up appointment.

## 2014-09-24 ENCOUNTER — Ambulatory Visit (HOSPITAL_COMMUNITY)
Admission: RE | Admit: 2014-09-24 | Discharge: 2014-09-24 | Disposition: A | Discharge status: Home / Self Care | Payer: Medicare Other | Source: Ambulatory Visit | Attending: Cardiology | Admitting: Cardiology

## 2014-09-24 DIAGNOSIS — I25119 Atherosclerotic heart disease of native coronary artery with unspecified angina pectoris: Secondary | ICD-10-CM

## 2014-09-30 ENCOUNTER — Other Ambulatory Visit: Payer: Self-pay | Admitting: Internal Medicine

## 2014-09-30 ENCOUNTER — Ambulatory Visit (HOSPITAL_COMMUNITY)
Admission: RE | Admit: 2014-09-30 | Discharge: 2014-09-30 | Disposition: A | Discharge status: Home / Self Care | Payer: Medicare Other | Source: Ambulatory Visit | Attending: Cardiology | Admitting: Cardiology

## 2014-09-30 DIAGNOSIS — E785 Hyperlipidemia, unspecified: Secondary | ICD-10-CM | POA: Insufficient documentation

## 2014-09-30 DIAGNOSIS — I509 Heart failure, unspecified: Secondary | ICD-10-CM | POA: Diagnosis not present

## 2014-09-30 DIAGNOSIS — J449 Chronic obstructive pulmonary disease, unspecified: Secondary | ICD-10-CM | POA: Diagnosis not present

## 2014-09-30 DIAGNOSIS — Z993 Dependence on wheelchair: Secondary | ICD-10-CM | POA: Insufficient documentation

## 2014-09-30 DIAGNOSIS — I252 Old myocardial infarction: Secondary | ICD-10-CM | POA: Insufficient documentation

## 2014-09-30 DIAGNOSIS — I25118 Atherosclerotic heart disease of native coronary artery with other forms of angina pectoris: Secondary | ICD-10-CM | POA: Insufficient documentation

## 2014-09-30 DIAGNOSIS — E119 Type 2 diabetes mellitus without complications: Secondary | ICD-10-CM | POA: Diagnosis not present

## 2014-09-30 DIAGNOSIS — Z87891 Personal history of nicotine dependence: Secondary | ICD-10-CM | POA: Diagnosis not present

## 2014-09-30 DIAGNOSIS — G7080 Lambert-Eaton syndrome, unspecified: Secondary | ICD-10-CM | POA: Diagnosis not present

## 2014-09-30 NOTE — Progress Notes (Signed)
Patient ID: Justin Clements, male   DOB: November 07, 1945, 69 y.o.   MRN: [id number]  Mr. Stimpson is wheelchair bound.  His echo was performed with him sitting in an upright position from his wheelchair, making his Apical images difficult and mildly off axis.   Deliah Boston, RDCS

## 2014-10-03 ENCOUNTER — Encounter: Payer: Self-pay | Admitting: *Deleted

## 2014-10-03 ENCOUNTER — Other Ambulatory Visit: Payer: Self-pay | Admitting: *Deleted

## 2014-10-16 ENCOUNTER — Non-Acute Institutional Stay (SKILLED_NURSING_FACILITY): Payer: Medicare Other | Admitting: Internal Medicine

## 2014-10-16 DIAGNOSIS — N400 Enlarged prostate without lower urinary tract symptoms: Secondary | ICD-10-CM

## 2014-10-16 DIAGNOSIS — I739 Peripheral vascular disease, unspecified: Secondary | ICD-10-CM | POA: Diagnosis not present

## 2014-10-16 DIAGNOSIS — I5032 Chronic diastolic (congestive) heart failure: Secondary | ICD-10-CM | POA: Diagnosis not present

## 2014-10-16 DIAGNOSIS — N3000 Acute cystitis without hematuria: Secondary | ICD-10-CM | POA: Diagnosis not present

## 2014-10-16 NOTE — Progress Notes (Signed)
MRN: [id number] Name: ADREN DOLLINS  Sex: male Age: 69 y.o. DOB: 15-Oct-1945  [place] #: [facility] Facility/Room: Level Of Care: SNF Provider: [name] D Emergency Contacts: Extended Emergency Contact Information Primary Emergency Contact: Sawyer,Nancy Address: [address]          [place] [address] [place] Phone: [phone] Relation: Spouse  Code Status: DNR  Allergies: Primaxin  Chief Complaint  Patient presents with  . Medical Management of Chronic Issues    HPI: Patient is 69 y.o. male who is being seen for routine issues.  Past Medical History  Diagnosis Date  . Coronary artery disease   . Diabetes mellitus without complication   . Rheumatoid arthritis   . Hyperlipidemia   . BPH (benign prostatic hyperplasia)   . Eaton-Lambert syndrome     progressive cerbellar ataxia  . GERD (gastroesophageal reflux disease)     History reviewed. No pertinent past surgical history.    Medication List       This list is accurate as of: 10/16/14 11:59 PM.  Always use your most recent med list.               CALCIUM 500 PO  Take by mouth. Calcium 500 mg tablet 1 po tid     calcium carbonate 500 MG chewable tablet  Commonly known as:  TUMS - dosed in mg elemental calcium  Chew 1 tablet by mouth 3 (three) times daily. For osteoarthritis     celecoxib 200 MG capsule  Commonly known as:  CELEBREX  Take 200 mg by mouth daily. For rheumatoid arthritits     cetirizine 10 MG tablet  Commonly known as:  ZYRTEC  Take 10 mg by mouth daily. For allergies     CHLORASEPTIC SORE THROAT 167 MG/5ML Liqd  Generic drug:  Acetaminophen  Take by mouth. Chloraseptic sore throat spray apply spray to throat every 4 hours as needed     acetaminophen 325 MG tablet  Commonly known as:  TYLENOL  Take 650 mg by mouth every 6 (six) hours as needed for pain. 325 mg caplet administer 2 tablets q6h prn mild pain     DUONEB IN  Inhale into the lungs.  duoneb 0.5 mg-'3mg'$ /64m soln inhale 1 vial via HFN every 6 hrs as needed for cough or shortness of breath Ipratropium bromide/albuterol sulfate     fluticasone 50 MCG/ACT nasal spray  Commonly known as:  FLONASE  Place 1 spray into both nostrils daily. For allergic rhinitis     guaifenesin 100 MG/5ML syrup  Commonly known as:  ROBITUSSIN  Take 200 mg by mouth 3 (three) times daily as needed for cough.     HYDROcodone-acetaminophen 5-325 MG per tablet  Commonly known as:  NORCO/VICODIN  Take one tablet by mouth every 6 hours as needed for pain     hydrocortisone butyrate 0.1 % Crea cream  Commonly known as:  LUCOID  Apply 1 application topically daily. For contact dermatitis     insulin aspart 100 UNIT/ML injection  Commonly known as:  novoLOG  Inject 4 Units into the skin 3 (three) times daily before meals.     KLOR-CON 10 PO  Take by mouth. klor-con 10 meq tab give 1 tab po daily for potassium supplement. Do not crush     LANTUS SOLOSTAR 100 UNIT/ML Solostar Pen  Generic drug:  Insulin Glargine  Inject 33 Units into the skin. For type 2 diabetic     LOPID PO  Take 600 mg by mouth 2 (two) times daily. For heart disease     metFORMIN 500 MG tablet  Commonly known as:  GLUCOPHAGE  Give 2 and 1/2 tablet po twice daily     metoprolol tartrate 25 MG tablet  Commonly known as:  LOPRESSOR  Take 25 mg by mouth 2 (two) times daily. For HTN     NITROSTAT 0.4 MG SL tablet  Generic drug:  nitroGLYCERIN  Place 0.4 mg under the tongue every 5 (five) minutes as needed for chest pain.     PERIDEX 0.12 % solution  Generic drug:  chlorhexidine  Use as directed 15 mLs in the mouth or throat 2 (two) times daily. Give 15 ml po swish and spit every night for gingivitis     PLAQUENIL 200 MG tablet  Generic drug:  hydroxychloroquine  Take 200 mg by mouth 2 (two) times daily. For rheumatoid arthritis     ranitidine 150 MG capsule  Commonly known as:  ZANTAC  Take 150 mg by mouth 2 (two)  times daily. For GERD     tamsulosin 0.4 MG Caps capsule  Commonly known as:  FLOMAX  Take by mouth. Administer 1 capsule po at hs for BPH (Tamsulosin HCL)     THERA Tabs  Take by mouth. Give 1 tablet by mouth daily. multivitains therapeutic     torsemide 5 MG tablet  Commonly known as:  DEMADEX  Give 1 and 1/2 tablet 7.5 mg po daily for pleural effusion (torsemide)     VITAMIN D-3 PO  Take by mouth. 50,000 units caps give 1 capsule po every month on 15th. (cholecalciferol)        No orders of the defined types were placed in this encounter.    Immunization History  Administered Date(s) Administered  . Influenza Whole 02/19/2013  . Influenza-Unspecified 02/26/2014  . PPD Test 03/05/2009  . Pneumococcal-Unspecified 03/17/2010    History  Substance Use Topics  . Smoking status: Former Smoker -- 0.25 packs/day for 50 years    Types: Cigarettes    Quit date: 03/19/2013  . Smokeless tobacco: Never Used  . Alcohol Use: No    Review of Systems  DATA OBTAINED: from patient, nurse GENERAL:  no fevers, fatigue, appetite changes; no c/o or concerns SKIN: No itching, rash HEENT: No complaint RESPIRATORY: No cough, wheezing, SOB CARDIAC: No chest pain, palpitations, lower extremity edema  GI: No abdominal pain, No N/V/D or constipation, No heartburn or reflux  GU: No dysuria, frequency or urgency, or incontinence  MUSCULOSKELETAL: No unrelieved bone/joint pain NEUROLOGIC: No headache, dizziness  PSYCHIATRIC: No overt anxiety or sadness  Filed Vitals:   10/26/14 1433  BP: 118/66  Pulse: 72  Temp: 98.2 F (36.8 C)  Resp: 20    Physical Exam  GENERAL APPEARANCE: Alert, conversant, No acute distress  SKIN: No diaphoresis rash HEENT: Unremarkable RESPIRATORY: Breathing is even, unlabored. Lung sounds are clear   CARDIOVASCULAR: Heart RRR no murmurs, rubs or gallops. No peripheral edema  GASTROINTESTINAL: Abdomen is soft, non-tender, not distended w/ normal bowel  sounds.  GENITOURINARY: Bladder non tender, not distended  MUSCULOSKELETAL: No abnormal joints or musculature NEUROLOGIC: Cranial nerves 2-12 grossly intact; LE weakness, pt in WC PSYCHIATRIC: Mood and affect appropriate to situation, no behavioral issues  Patient Active Problem List   Diagnosis Date Noted  . UTI (urinary tract infection) 10/26/2014  . PVD (peripheral vascular disease) 10/26/2014  . OA (osteoarthritis) 07/20/2014  . Duodenal ulcer 07/20/2014  . CHF (  congestive heart failure) 07/20/2014  . Pleural effusion 07/20/2014  . AR (allergic rhinitis) 07/20/2014  . Insulin use (long-term) in type 2 diabetes 07/20/2014  . Dysphagia, oropharyngeal 06/22/2014  . Anemia of chronic disease 02/15/2014  . Hypertensive heart disease with CHF (congestive heart failure) 02/15/2014  . PNA (pneumonia) 02/15/2014  . Chronic diarrhea 02/15/2014  . DM (diabetes mellitus), type 2 with peripheral vascular complications 47/65/4650  . GERD (gastroesophageal reflux disease)   . Rheumatoid arthritis   . Hyperlipidemia   . BPH (benign prostatic hyperplasia)   . Eaton-Lambert syndrome   . Pulmonary infiltrates 12/22/2012  . COPD  undetermined GOLD status 12/22/2012  . Smoker 12/22/2012    CBC    Component Value Date/Time   WBC 4.2 08/16/2014   WBC 8.3 03/21/2013 0932   RBC 4.73 03/21/2013 0932   HGB 12.9* 08/16/2014   HCT 40* 08/16/2014   PLT 389 08/16/2014   MCV 81.6 03/21/2013 0932    CMP     Component Value Date/Time   NA 137 08/16/2014   K 3.9 08/16/2014   BUN 19 08/16/2014   CREATININE 0.6 08/16/2014   AST 12* 08/16/2014   ALT 8* 08/16/2014   ALKPHOS 113 08/16/2014    Assessment and Plan  CHF (congestive heart failure) Pt was seen by cardiology Dr Debara Pickett for remote hx CAD and CHF. ECHO was ordered which showed grade 1 diastolic dysfunction, and preserved EF 65-70%.; pt on lopressor and demadex and stable;Plan- will continue current meds and wait for Cardilogy f/u  UTI  (urinary tract infection) Acute since last visit on 4/26, grew out proteus sensitive to ampicillin, was tx with Augmentin for 5 days with resolution  BPH (benign prostatic hyperplasia) Recent UTI and a new problem; Plan- cont flomax and urology consult  PVD (peripheral vascular disease) No new wounds or pain :plan -continue Lopid and improve diabetic control    Hennie Duos, MD

## 2014-10-26 ENCOUNTER — Encounter: Payer: Self-pay | Admitting: Internal Medicine

## 2014-10-26 DIAGNOSIS — I739 Peripheral vascular disease, unspecified: Secondary | ICD-10-CM

## 2014-10-26 DIAGNOSIS — N390 Urinary tract infection, site not specified: Secondary | ICD-10-CM | POA: Insufficient documentation

## 2014-10-26 HISTORY — DX: Peripheral vascular disease, unspecified: I73.9

## 2014-10-26 NOTE — Assessment & Plan Note (Signed)
No new wounds or pain :plan -continue Lopid and improve diabetic control

## 2014-10-26 NOTE — Assessment & Plan Note (Signed)
Pt was seen by cardiology Dr Debara Pickett for remote hx CAD and CHF. ECHO was ordered which showed grade 1 diastolic dysfunction, and preserved EF 65-70%.; pt on lopressor and demadex and stable;Plan- will continue current meds and wait for Cardilogy f/u

## 2014-10-26 NOTE — Assessment & Plan Note (Signed)
Recent UTI and a new problem; Plan- cont flomax and urology consult

## 2014-10-26 NOTE — Assessment & Plan Note (Signed)
Acute since last visit on 4/26, grew out proteus sensitive to ampicillin, was tx with Augmentin for 5 days with resolution

## 2015-03-24 LAB — TSH: TSH: 4.05 u[IU]/mL (ref 0.41–5.90)

## 2015-04-09 ENCOUNTER — Non-Acute Institutional Stay (SKILLED_NURSING_FACILITY): Payer: Medicare Other | Admitting: Internal Medicine

## 2015-04-09 DIAGNOSIS — J449 Chronic obstructive pulmonary disease, unspecified: Secondary | ICD-10-CM | POA: Diagnosis not present

## 2015-04-09 DIAGNOSIS — G7080 Lambert-Eaton syndrome, unspecified: Secondary | ICD-10-CM

## 2015-04-09 DIAGNOSIS — R1312 Dysphagia, oropharyngeal phase: Secondary | ICD-10-CM

## 2015-04-10 ENCOUNTER — Encounter: Payer: Self-pay | Admitting: Internal Medicine

## 2015-04-10 NOTE — Progress Notes (Signed)
MRN: [id number] Name: Justin Clements  Sex: male Age: 69 y.o. DOB: 1945/08/29  [place] #: [facility] Facility/Room: Level Of Care: SNF Provider: [name] D Emergency Contacts: Extended Emergency Contact Information Primary Emergency Contact: Delrossi,Nancy Address: [address]           [address] [place] Phone: [phone] Relation: Spouse  Code Status:   Allergies: Primaxin  Chief Complaint  Patient presents with  . Medical Management of Chronic Issues    HPI: Patient is 69 y.o. male with DM2, CAD, HLD, ELS, RA and dysphagia who is being seen for routine issues of ELS, dysphagia and COPD.   Past Medical History  Diagnosis Date  . Coronary artery disease   . Diabetes mellitus without complication ([place])   . Rheumatoid arthritis ([place])   . Hyperlipidemia   . BPH (benign prostatic hyperplasia)   . 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 GERD (gastroesophageal reflux disease)     No past surgical history on file.    Medication List       This list is accurate as of: 04/09/15 11:59 PM.  Always use your most recent med list.               CALCIUM 500 PO  Take by mouth. Calcium 500 mg tablet 1 po tid     calcium carbonate 500 MG chewable tablet  Commonly known as:  TUMS - dosed in mg elemental calcium  Chew 1 tablet by mouth 3 (three) times daily. For osteoarthritis     celecoxib 200 MG capsule  Commonly known as:  CELEBREX  Take 200 mg by mouth daily. For rheumatoid arthritits     cetirizine 10 MG tablet  Commonly known as:  ZYRTEC  Take 10 mg by mouth daily. For allergies     CHLORASEPTIC SORE THROAT 167 MG/5ML Liqd  Generic drug:  Acetaminophen  Take by mouth. Chloraseptic sore throat spray apply spray to throat every 4 hours as needed     acetaminophen 325 MG tablet  Commonly known as:  TYLENOL  Take 650 mg by mouth every 6 (six) hours as needed for pain. 325 mg caplet administer 2  tablets q6h prn mild pain     DUONEB IN  Inhale into the lungs. duoneb 0.5 mg-'3mg'$ /39m soln inhale 1 vial via HFN every 6 hrs as needed for cough or shortness of breath Ipratropium bromide/albuterol sulfate     fluticasone 50 MCG/ACT nasal spray  Commonly known as:  FLONASE  Place 1 spray into both nostrils daily. For allergic rhinitis     guaifenesin 100 MG/5ML syrup  Commonly known as:  ROBITUSSIN  Take 200 mg by mouth 3 (three) times daily as needed for cough.     HYDROcodone-acetaminophen 5-325 MG tablet  Commonly known as:  NORCO/VICODIN  Take one tablet by mouth every 6 hours as needed for pain     hydrocortisone butyrate 0.1 % Crea cream  Commonly known as:  LUCOID  Apply 1 application topically daily. For contact dermatitis     insulin aspart 100 UNIT/ML injection  Commonly known as:  novoLOG  Inject 4 Units into the skin 3 (three) times daily before meals.     KLOR-CON 10 PO  Take by mouth. klor-con 10 meq tab give 1 tab po daily for potassium supplement. Do not crush     LANTUS SOLOSTAR 100 UNIT/ML Solostar Pen  Generic drug:  Insulin Glargine  Inject 33  Units into the skin. For type 2 diabetic     LOPID PO  Take 600 mg by mouth 2 (two) times daily. For heart disease     metFORMIN 500 MG tablet  Commonly known as:  GLUCOPHAGE  Give 2 and 1/2 tablet po twice daily     metoprolol tartrate 25 MG tablet  Commonly known as:  LOPRESSOR  Take 25 mg by mouth 2 (two) times daily. For HTN     NITROSTAT 0.4 MG SL tablet  Generic drug:  nitroGLYCERIN  Place 0.4 mg under the tongue every 5 (five) minutes as needed for chest pain.     PERIDEX 0.12 % solution  Generic drug:  chlorhexidine  Use as directed 15 mLs in the mouth or throat 2 (two) times daily. Give 15 ml po swish and spit every night for gingivitis     PLAQUENIL 200 MG tablet  Generic drug:  hydroxychloroquine  Take 200 mg by mouth 2 (two) times daily. For rheumatoid arthritis     ranitidine 150 MG capsule   Commonly known as:  ZANTAC  Take 150 mg by mouth 2 (two) times daily. For GERD     tamsulosin 0.4 MG Caps capsule  Commonly known as:  FLOMAX  Take by mouth. Administer 1 capsule po at hs for BPH (Tamsulosin HCL)     THERA Tabs  Take by mouth. Give 1 tablet by mouth daily. multivitains therapeutic     torsemide 5 MG tablet  Commonly known as:  DEMADEX  Give 1 and 1/2 tablet 7.5 mg po daily for pleural effusion (torsemide)     VITAMIN D-3 PO  Take by mouth. 50,000 units caps give 1 capsule po every month on 15th. (cholecalciferol)        No orders of the defined types were placed in this encounter.    Immunization History  Administered Date(s) Administered  . Influenza Whole 02/19/2013  . Influenza-Unspecified 02/26/2014  . PPD Test 03/05/2009  . Pneumococcal-Unspecified 03/17/2010    Social History  Substance Use Topics  . Smoking status: Former Smoker -- 0.25 packs/day for 50 years    Types: Cigarettes    Quit date: 03/19/2013  . Smokeless tobacco: Never Used  . Alcohol Use: No    Review of Systems  DATA OBTAINED: from patient, nurse, medical record GENERAL:  no fevers, fatigue, appetite changes SKIN: No itching, rash HEENT: No complaint RESPIRATORY: No cough, wheezing, SOB CARDIAC: No chest pain, palpitations, lower extremity edema  GI: No abdominal pain, No N/V/D or constipation, No heartburn or reflux  GU: No dysuria, frequency or urgency, or incontinence  MUSCULOSKELETAL: No unrelieved bone/joint pain NEUROLOGIC: No headache, dizziness  PSYCHIATRIC: No overt anxiety or sadness  Filed Vitals:   04/10/15 1448  BP: 122/70  Pulse: 78  Temp: 98.9 F (37.2 C)  Resp: 22    Physical Exam  GENERAL APPEARANCE: Alert, conversant, No acute distress ; WM in WC SKIN: No diaphoresis rash, or wounds HEENT: Unremarkable RESPIRATORY: Breathing is even, unlabored. Lung sounds are clear   CARDIOVASCULAR: Heart RRR no murmurs, rubs or gallops. No peripheral  edema  GASTROINTESTINAL: Abdomen is soft, non-tender, not distended w/ normal bowel sounds.  GENITOURINARY: Bladder non tender, not distended  MUSCULOSKELETAL: No abnormal joints or musculature NEUROLOGIC: Cranial nerves 2-12 grossly intact. Moves all extremities, WC bound 2/2 ataxia PSYCHIATRIC: Mood and affect appropriate to situation, no behavioral issues  Patient Active Problem List   Diagnosis Date Noted  . UTI (urinary tract infection)  10/26/2014  . PVD (peripheral vascular disease) ([place]) 10/26/2014  . OA (osteoarthritis) 07/20/2014  . Duodenal ulcer 07/20/2014  . CHF (congestive heart failure) ([place]) 07/20/2014  . Pleural effusion 07/20/2014  . AR (allergic rhinitis) 07/20/2014  . Insulin use (long-term) in type 2 diabetes ([place]) 07/20/2014  . Dysphagia, oropharyngeal 06/22/2014  . Anemia of chronic disease 02/15/2014  . Hypertensive heart disease with CHF (congestive heart failure) ([place]) 02/15/2014  . PNA (pneumonia) 02/15/2014  . Chronic diarrhea 02/15/2014  . DM (diabetes mellitus), type 2 with peripheral vascular complications ([place]) 71/69/6789  . GERD (gastroesophageal reflux disease)   . Rheumatoid arthritis ([place])   . Hyperlipidemia   . BPH (benign prostatic hyperplasia)   . 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 Pulmonary infiltrates 12/22/2012  . COPD  undetermined GOLD status 12/22/2012  . 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

## 2015-04-11 NOTE — Assessment & Plan Note (Signed)
No exacerbations, does not require daily resp meds for wheezing or SOB; plan - cont to moniotor

## 2015-04-11 NOTE — Assessment & Plan Note (Signed)
Chronic and progressive without major declines; pt works with restorative therapy;pt is very functional in [facility], out of room daily and about buildingg; plan - cont monitor and support

## 2015-04-11 NOTE — Assessment & Plan Note (Signed)
No changes since prior, pt declines thickened liquids, has waiver, has been treated for bronchitis once but no PNAs; plan - to monitor and pt aware to speak up for any incidents or oncoming infections

## 2015-04-22 ENCOUNTER — Non-Acute Institutional Stay (SKILLED_NURSING_FACILITY): Payer: Medicare Other | Admitting: Internal Medicine

## 2015-04-22 DIAGNOSIS — J449 Chronic obstructive pulmonary disease, unspecified: Secondary | ICD-10-CM | POA: Diagnosis not present

## 2015-04-22 DIAGNOSIS — J690 Pneumonitis due to inhalation of food and vomit: Secondary | ICD-10-CM | POA: Diagnosis not present

## 2015-04-22 DIAGNOSIS — J90 Pleural effusion, not elsewhere classified: Secondary | ICD-10-CM

## 2015-04-22 NOTE — Progress Notes (Signed)
MRN: [id number] Name: Justin Clements  Sex: male Age: 69 y.o. DOB: October 30, 1945  [place] [facility]: [facility] Facility/Room: Level Of Care: SNF Provider: [name] D Emergency Contacts: Extended Emergency Contact Information Primary Emergency Contact: Vandevender,Nancy Address: [address]          [place] [address] [place] Phone: [phone] Relation: Spouse  Code Status:   Allergies: Primaxin  Chief Complaint  Patient presents with  . Acute Visit    HPI: Patient is 69 y.o. male with Eaton-Lambert syndrome, RA, HLD, DM2, GERD, former [employer] who is seen today for a cough for 4 days; not productive, admits L side CP worse with breathing. No fever, no SOB, pt is up about facility as usual.   Past Medical History  Diagnosis Date  . Coronary artery disease   . Diabetes mellitus without complication ([place])   . Rheumatoid arthritis ([place])   . Hyperlipidemia   . BPH (benign prostatic hyperplasia)   . 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 GERD (gastroesophageal reflux disease)     History reviewed. No pertinent past surgical history.    Medication List       This list is accurate as of: 04/22/15 11:59 PM.  Always use your most recent med list.               CALCIUM 500 PO  Take by mouth. Calcium 500 mg tablet 1 po tid     calcium carbonate 500 MG chewable tablet  Commonly known as:  TUMS - dosed in mg elemental calcium  Chew 1 tablet by mouth 3 (three) times daily. For osteoarthritis     celecoxib 200 MG capsule  Commonly known as:  CELEBREX  Take 200 mg by mouth daily. For rheumatoid arthritits     cetirizine 10 MG tablet  Commonly known as:  ZYRTEC  Take 10 mg by mouth daily. For allergies     CHLORASEPTIC SORE THROAT 167 MG/5ML Liqd  Generic drug:  Acetaminophen  Take by mouth. Chloraseptic sore throat spray apply spray to throat every 4 hours as needed     acetaminophen 325 MG tablet  Commonly known as:   TYLENOL  Take 650 mg by mouth every 6 (six) hours as needed for pain. 325 mg caplet administer 2 tablets q6h prn mild pain     DUONEB IN  Inhale into the lungs. duoneb 0.5 mg-'3mg'$ /51m soln inhale 1 vial via HFN every 6 hrs as needed for cough or shortness of breath Ipratropium bromide/albuterol sulfate     fluticasone 50 MCG/ACT nasal spray  Commonly known as:  FLONASE  Place 1 spray into both nostrils daily. For allergic rhinitis     guaifenesin 100 MG/5ML syrup  Commonly known as:  ROBITUSSIN  Take 200 mg by mouth 3 (three) times daily as needed for cough.     HYDROcodone-acetaminophen 5-325 MG tablet  Commonly known as:  NORCO/VICODIN  Take one tablet by mouth every 6 hours as needed for pain     hydrocortisone butyrate 0.1 % Crea cream  Commonly known as:  LUCOID  Apply 1 application topically daily. For contact dermatitis     insulin aspart 100 UNIT/ML injection  Commonly known as:  novoLOG  Inject 4 Units into the skin 3 (three) times daily before meals.     KLOR-CON 10 PO  Take by mouth. klor-con 10 meq tab give 1 tab po daily for potassium supplement. Do not crush  LANTUS SOLOSTAR 100 UNIT/ML Solostar Pen  Generic drug:  Insulin Glargine  Inject 33 Units into the skin. For type 2 diabetic     LOPID PO  Take 600 mg by mouth 2 (two) times daily. For heart disease     metFORMIN 500 MG tablet  Commonly known as:  GLUCOPHAGE  Give 2 and 1/2 tablet po twice daily     metoprolol tartrate 25 MG tablet  Commonly known as:  LOPRESSOR  Take 25 mg by mouth 2 (two) times daily. For HTN     NITROSTAT 0.4 MG SL tablet  Generic drug:  nitroGLYCERIN  Place 0.4 mg under the tongue every 5 (five) minutes as needed for chest pain.     PERIDEX 0.12 % solution  Generic drug:  chlorhexidine  Use as directed 15 mLs in the mouth or throat 2 (two) times daily. Give 15 ml po swish and spit every night for gingivitis     PLAQUENIL 200 MG tablet  Generic drug:  hydroxychloroquine   Take 200 mg by mouth 2 (two) times daily. For rheumatoid arthritis     ranitidine 150 MG capsule  Commonly known as:  ZANTAC  Take 150 mg by mouth 2 (two) times daily. For GERD     tamsulosin 0.4 MG Caps capsule  Commonly known as:  FLOMAX  Take by mouth. Administer 1 capsule po at hs for BPH (Tamsulosin HCL)     THERA Tabs  Take by mouth. Give 1 tablet by mouth daily. multivitains therapeutic     torsemide 5 MG tablet  Commonly known as:  DEMADEX  Give 1 and 1/2 tablet 7.5 mg po daily for pleural effusion (torsemide)     VITAMIN D-3 PO  Take by mouth. 50,000 units caps give 1 capsule po every month on 15th. (cholecalciferol)        No orders of the defined types were placed in this encounter.    Immunization History  Administered Date(s) Administered  . Influenza Whole 02/19/2013  . Influenza-Unspecified 02/26/2014  . PPD Test 03/05/2009  . Pneumococcal-Unspecified 03/17/2010    Social History  Substance Use Topics  . Smoking status: Former Smoker -- 0.25 packs/day for 50 years    Types: Cigarettes    Quit date: 03/19/2013  . Smokeless tobacco: Never Used  . Alcohol Use: No    Review of Systems  DATA OBTAINED: from patient GENERAL:  no fevers, fatigue, appetite changes SKIN: No itching, rash HEENT: No complaint RESPIRATORY: + cough,no wheezing, SOB-pt denies CARDIAC: L side chest pain, no palpitations, lower extremity edema  GI: No abdominal pain, No N/V/D or constipation, No heartburn or reflux  GU: No dysuria, frequency or urgency, or incontinence  MUSCULOSKELETAL: No unrelieved bone/joint pain NEUROLOGIC: No headache, dizziness  PSYCHIATRIC: No overt anxiety or sadness  Filed Vitals:   04/23/15 0822  BP: 120/71  Pulse: 74  Temp: 97.9 F (36.6 C)  Resp: 20    Physical Exam  GENERAL APPEARANCE: Alert, conversant, No acute distress  SKIN: No diaphoresis rash HEENT: Unremarkable RESPIRATORY: Breathing is even, unlabored. Lung sounds are  egophonas  L base and dec BS R base; no wheezing CARDIOVASCULAR: Heart RRR no murmurs, rubs or gallops. No peripheral edema  GASTROINTESTINAL: Abdomen is soft, non-tender, not distended w/ normal bowel sounds.  GENITOURINARY: Bladder non tender, not distended  MUSCULOSKELETAL: rheumatoid nodules hand and elbows NEUROLOGIC: Cranial nerves 2-12 grossly intact. Moves all extremities UE > LE PSYCHIATRIC: Mood and affect appropriate to situation, no behavioral  issues  Patient Active Problem List   Diagnosis Date Noted  . Aspiration pneumonia ([place]) 04/23/2015  . UTI (urinary tract infection) 10/26/2014  . PVD (peripheral vascular disease) ([place]) 10/26/2014  . OA (osteoarthritis) 07/20/2014  . Duodenal ulcer 07/20/2014  . CHF (congestive heart failure) ([place]) 07/20/2014  . Pleural effusion 07/20/2014  . AR (allergic rhinitis) 07/20/2014  . Insulin use (long-term) in type 2 diabetes ([place]) 07/20/2014  . Dysphagia, oropharyngeal 06/22/2014  . Anemia of chronic disease 02/15/2014  . Hypertensive heart disease with CHF (congestive heart failure) ([place]) 02/15/2014  . PNA (pneumonia) 02/15/2014  . Chronic diarrhea 02/15/2014  . DM (diabetes mellitus), type 2 with peripheral vascular complications ([place]) 09/62/8366  . GERD (gastroesophageal reflux disease)   . Rheumatoid arthritis ([place])   . Hyperlipidemia   . BPH (benign prostatic hyperplasia)   . 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 Pulmonary infiltrates 12/22/2012  . COPD  undetermined GOLD status 12/22/2012  . Smoker 12/22/2012    CBC    Component Value Date/Time   WBC 4.2 08/16/2014   WBC 8.3 03/21/2013 0932   RBC 4.73 03/21/2013 0932   HGB 12.9* 08/16/2014   HCT 40* 08/16/2014   PLT 389 08/16/2014   MCV 81.6 03/21/2013 0932    CMP     Component Value Date/Time   NA 137 08/16/2014   K 3.9 08/16/2014   BUN 19 08/16/2014   CREATININE 0.6 08/16/2014   AST 12* 08/16/2014   ALT 8* 08/16/2014   ALKPHOS 113 08/16/2014    Assessment  and Plan  Aspiration pneumonia ([place]) Pt with dysphagia but eats what he wants to eat. Exam and hx c/w PNA and CXR showed Infiltrate L base, plate like atelectasis R base and L pleural effusion. Will treat with Augmentin 875 BID for 10 days 2/2 pt is on imunosuppresive drugs. Nebs regularly TID for 7 days and guaifenesin 600 BID for 7 days. Pt declined any meds for pleuritic pain or cough.  Pleural effusion Chronic L pleural effusion, seen CT 04/2014; not related to todays PNA  COPD  undetermined GOLD status Does not appear to be having exacerbation, but scheduled  Nebs TID, pt does not normally need nebs    Hennie Duos, MD

## 2015-04-23 ENCOUNTER — Encounter: Payer: Self-pay | Admitting: Internal Medicine

## 2015-04-23 DIAGNOSIS — J690 Pneumonitis due to inhalation of food and vomit: Secondary | ICD-10-CM | POA: Insufficient documentation

## 2015-04-23 HISTORY — DX: Pneumonitis due to inhalation of food and vomit: J69.0

## 2015-04-23 NOTE — Assessment & Plan Note (Signed)
Chronic L pleural effusion, seen CT 04/2014; not related to todays PNA

## 2015-04-23 NOTE — Assessment & Plan Note (Signed)
Pt with dysphagia but eats what he wants to eat. Exam and hx c/w PNA and CXR showed Infiltrate L base, plate like atelectasis R base and L pleural effusion. Will treat with Augmentin 875 BID for 10 days 2/2 pt is on imunosuppresive drugs. Nebs regularly TID for 7 days and guaifenesin 600 BID for 7 days. Pt declined any meds for pleuritic pain or cough.

## 2015-04-23 NOTE — Assessment & Plan Note (Signed)
Does not appear to be having exacerbation, but scheduled  Nebs TID, pt does not normally need nebs

## 2015-05-23 ENCOUNTER — Non-Acute Institutional Stay (SKILLED_NURSING_FACILITY): Payer: Medicare Other | Admitting: Internal Medicine

## 2015-05-23 DIAGNOSIS — K219 Gastro-esophageal reflux disease without esophagitis: Secondary | ICD-10-CM

## 2015-05-23 DIAGNOSIS — I110 Hypertensive heart disease with heart failure: Secondary | ICD-10-CM

## 2015-05-23 DIAGNOSIS — J690 Pneumonitis due to inhalation of food and vomit: Secondary | ICD-10-CM | POA: Diagnosis not present

## 2015-05-26 ENCOUNTER — Encounter: Payer: Self-pay | Admitting: Internal Medicine

## 2015-05-26 NOTE — Assessment & Plan Note (Signed)
Pt has recovered from his PNA. Former marine, he was up every day anyway. No coughing, back to usual self.

## 2015-05-26 NOTE — Assessment & Plan Note (Signed)
PB continues to be controlled with metoprolol and demadex;plan - cont curent meds

## 2015-05-26 NOTE — Progress Notes (Signed)
MRN: [id number] Name: Justin Clements  Sex: male Age: 70 y.o. DOB: 07-08-1945  [place] [facility]: [facility] Facility/Room:422 Level Of Care: SNF Provider: [name] D Emergency Contacts: Extended Emergency Contact Information Primary Emergency Contact: Smylie,Nancy Address: [address]          [place] [address] [place] Phone: [phone] Relation: Spouse  Code Status:   Allergies: Primaxin  Chief Complaint  Patient presents with  . Medical Management of Chronic Issues    HPI: Patient is 70 y.o. 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  Past Medical History  Diagnosis Date  . Coronary artery disease   . Diabetes mellitus without complication ([place])   . Rheumatoid arthritis ([place])   . Hyperlipidemia   . BPH (benign prostatic hyperplasia)   . 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 GERD (gastroesophageal reflux disease)     No past surgical history on file.    Medication List       This list is accurate as of: 05/23/15 11:59 PM.  Always use your most recent med list.               CALCIUM 500 PO  Take by mouth. Calcium 500 mg tablet 1 po tid     calcium carbonate 500 MG chewable tablet  Commonly known as:  TUMS - dosed in mg elemental calcium  Chew 1 tablet by mouth 3 (three) times daily. For osteoarthritis     celecoxib 200 MG capsule  Commonly known as:  CELEBREX  Take 200 mg by mouth daily. For rheumatoid arthritits     cetirizine 10 MG tablet  Commonly known as:  ZYRTEC  Take 10 mg by mouth daily. For allergies     CHLORASEPTIC SORE THROAT 167 MG/5ML Liqd  Generic drug:  Acetaminophen  Take by mouth. Chloraseptic sore throat spray apply spray to throat every 4 hours as needed     acetaminophen 325 MG tablet  Commonly known as:  TYLENOL  Take 650 mg by mouth every 6 (six) hours as needed for  pain. 325 mg caplet administer 2 tablets q6h prn mild pain     DUONEB IN  Inhale into the lungs. duoneb 0.5 mg-'3mg'$ /32m soln inhale 1 vial via HFN every 6 hrs as needed for cough or shortness of breath Ipratropium bromide/albuterol sulfate     fluticasone 50 MCG/ACT nasal spray  Commonly known as:  FLONASE  Place 1 spray into both nostrils daily. For allergic rhinitis     guaifenesin 100 MG/5ML syrup  Commonly known as:  ROBITUSSIN  Take 200 mg by mouth 3 (three) times daily as needed for cough.     HYDROcodone-acetaminophen 5-325 MG tablet  Commonly known as:  NORCO/VICODIN  Take one tablet by mouth every 6 hours as needed for pain     hydrocortisone butyrate 0.1 % Crea cream  Commonly known as:  LUCOID  Apply 1 application topically daily. For contact dermatitis     insulin aspart 100 UNIT/ML injection  Commonly known as:  novoLOG  Inject 4 Units into the skin 3 (three) times daily before meals.     KLOR-CON 10 PO  Take by mouth. klor-con 10 meq tab give 1 tab po daily for potassium supplement. Do not crush     LANTUS SOLOSTAR 100 UNIT/ML Solostar Pen  Generic drug:  Insulin Glargine  Inject 33 Units into the skin. For type 2 diabetic     LOPID PO  Take 600 mg by mouth 2 (two) times daily. For heart disease     metFORMIN 500 MG tablet  Commonly known as:  GLUCOPHAGE  Give 2 and 1/2 tablet po twice daily     metoprolol tartrate 25 MG tablet  Commonly known as:  LOPRESSOR  Take 25 mg by mouth 2 (two) times daily. For HTN     NITROSTAT 0.4 MG SL tablet  Generic drug:  nitroGLYCERIN  Place 0.4 mg under the tongue every 5 (five) minutes as needed for chest pain.     PERIDEX 0.12 % solution  Generic drug:  chlorhexidine  Use as directed 15 mLs in the mouth or throat 2 (two) times daily. Give 15 ml po swish and spit every night for gingivitis     PLAQUENIL 200 MG tablet  Generic drug:  hydroxychloroquine  Take 200 mg by mouth 2 (two) times daily. For rheumatoid  arthritis     ranitidine 150 MG capsule  Commonly known as:  ZANTAC  Take 150 mg by mouth 2 (two) times daily. For GERD     tamsulosin 0.4 MG Caps capsule  Commonly known as:  FLOMAX  Take by mouth. Administer 1 capsule po at hs for BPH (Tamsulosin HCL)     THERA Tabs  Take by mouth. Give 1 tablet by mouth daily. multivitains therapeutic     torsemide 5 MG tablet  Commonly known as:  DEMADEX  Give 1 and 1/2 tablet 7.5 mg po daily for pleural effusion (torsemide)     VITAMIN D-3 PO  Take by mouth. 50,000 units caps give 1 capsule po every month on 15th. (cholecalciferol)        No orders of the defined types were placed in this encounter.    Immunization History  Administered Date(s) Administered  . Influenza Whole 02/19/2013  . Influenza-Unspecified 02/26/2014  . PPD Test 03/05/2009  . Pneumococcal-Unspecified 03/17/2010    Social History  Substance Use Topics  . Smoking status: Former Smoker -- 0.25 packs/day for 50 years    Types: Cigarettes    Quit date: 03/19/2013  . Smokeless tobacco: Never Used  . Alcohol Use: No    Review of Systems  DATA OBTAINED: from patient, nurse GENERAL:  no fevers, fatigue, appetite changes SKIN: No itching, rash HEENT: No complaint RESPIRATORY: No cough, wheezing, SOB CARDIAC: No chest pain, palpitations, lower extremity edema  GI: No abdominal pain, No N/V/D or constipation, No heartburn or reflux  GU: No dysuria, frequency or urgency, or incontinence  MUSCULOSKELETAL: No unrelieved bone/joint pain NEUROLOGIC: No headache, dizziness  PSYCHIATRIC: No overt anxiety or sadness  Filed Vitals:   05/26/15 1716  BP: 104/54  Pulse: 84  Temp: 98.5 F (36.9 C)  Resp: 20    Physical Exam  GENERAL APPEARANCE: Alert, conversant, No acute distress  SKIN: No diaphoresis rash HEENT: Unremarkable RESPIRATORY: Breathing is even, unlabored. Lung sounds are clear   CARDIOVASCULAR: Heart RRR no murmurs, rubs or gallops. No peripheral  edema  GASTROINTESTINAL: Abdomen is soft, non-tender, not distended w/ normal bowel sounds.  GENITOURINARY: Bladder non tender, not distended  MUSCULOSKELETAL: No abnormal joints or musculature NEUROLOGIC: Cranial nerves 2-12 grossly intact. Moves all extremities UE> LE PSYCHIATRIC: Mood and affect appropriate to situation, no behavioral issues  Patient Active Problem List   Diagnosis Date Noted  . Aspiration pneumonia ([place]) 04/23/2015  . UTI (urinary tract infection) 10/26/2014  .  PVD (peripheral vascular disease) ([place]) 10/26/2014  . OA (osteoarthritis) 07/20/2014  . Duodenal ulcer 07/20/2014  . CHF (congestive heart failure) ([place]) 07/20/2014  . Pleural effusion 07/20/2014  . AR (allergic rhinitis) 07/20/2014  . Insulin use (long-term) in type 2 diabetes ([place]) 07/20/2014  . Dysphagia, oropharyngeal 06/22/2014  . Anemia of chronic disease 02/15/2014  . Hypertensive heart disease with CHF (congestive heart failure) ([place]) 02/15/2014  . PNA (pneumonia) 02/15/2014  . Chronic diarrhea 02/15/2014  . DM (diabetes mellitus), type 2 with peripheral vascular complications ([place]) 91/50/5697  . GERD (gastroesophageal reflux disease)   . Rheumatoid arthritis ([place])   . Hyperlipidemia   . BPH (benign prostatic hyperplasia)   . 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 Pulmonary infiltrates 12/22/2012  . COPD  undetermined GOLD status 12/22/2012  . Smoker 12/22/2012    CBC    Component Value Date/Time   WBC 4.2 08/16/2014   WBC 8.3 03/21/2013 0932   RBC 4.73 03/21/2013 0932   HGB 12.9* 08/16/2014   HCT 40* 08/16/2014   PLT 389 08/16/2014   MCV 81.6 03/21/2013 0932    CMP     Component Value Date/Time   NA 137 08/16/2014   K 3.9 08/16/2014   BUN 19 08/16/2014   CREATININE 0.6 08/16/2014   AST 12* 08/16/2014   ALT 8* 08/16/2014   ALKPHOS 113 08/16/2014    Assessment and Plan  Aspiration pneumonia ([place]) Pt has recovered from his PNA. Former marine, he was up every day anyway. No  coughing, back to usual self.  Hypertensive heart disease with CHF (congestive heart failure) PB continues to be controlled with metoprolol and demadex;plan - cont curent meds  GERD (gastroesophageal reflux disease) With hx of prior duodenal ulcer;pt on high risk meds-plaquenil and celebrex-;pt on zantac 150 BID without complications     Hennie Duos, MD

## 2015-05-26 NOTE — Assessment & Plan Note (Signed)
With hx of prior duodenal ulcer;pt on high risk meds-plaquenil and celebrex-;pt on zantac 150 BID without complications 

## 2015-05-26 NOTE — Progress Notes (Signed)
This encounter was created in error - please disregard.

## 2015-06-24 LAB — LIPID PANEL
Cholesterol: 124 mg/dL (ref 0–200)
HDL: 24 mg/dL — AB (ref 35–70)
LDL Cholesterol: 70 mg/dL
Triglycerides: 151 mg/dL (ref 40–160)

## 2015-06-24 LAB — CBC AND DIFFERENTIAL
HCT: 41 % (ref 41–53)
Hemoglobin: 13.2 g/dL — AB (ref 13.5–17.5)
Platelets: 345 10*3/uL (ref 150–399)
WBC: 3.3 10*3/mL

## 2015-06-24 LAB — HEPATIC FUNCTION PANEL
ALT: 8 U/L — AB (ref 10–40)
AST: 15 U/L (ref 14–40)
Alkaline Phosphatase: 102 U/L (ref 25–125)
Bilirubin, Total: 0.2 mg/dL

## 2015-06-24 LAB — HEMOGLOBIN A1C: Hemoglobin A1C: 8

## 2015-07-02 ENCOUNTER — Non-Acute Institutional Stay (SKILLED_NURSING_FACILITY): Payer: Medicare Other | Admitting: Internal Medicine

## 2015-07-02 DIAGNOSIS — M0579 Rheumatoid arthritis with rheumatoid factor of multiple sites without organ or systems involvement: Secondary | ICD-10-CM

## 2015-07-02 DIAGNOSIS — I5032 Chronic diastolic (congestive) heart failure: Secondary | ICD-10-CM | POA: Diagnosis not present

## 2015-07-02 DIAGNOSIS — N400 Enlarged prostate without lower urinary tract symptoms: Secondary | ICD-10-CM

## 2015-07-06 ENCOUNTER — Encounter: Payer: Self-pay | Admitting: Internal Medicine

## 2015-07-06 NOTE — Assessment & Plan Note (Signed)
No UTIs known sinc eon flimax;plan - cont flomax

## 2015-07-06 NOTE — Assessment & Plan Note (Signed)
Symptoms are controlled on palquenil and celebrex; pt is out of room daily; plan - cont current meds

## 2015-07-06 NOTE — Progress Notes (Signed)
MRN: [id number] Name: Justin Clements  Sex: male Age: 70 y.o. DOB: Mar 13, 1946  [place] #: [facility] Facility/Room: Level Of Care: SNF Provider: [name] D Emergency Contacts: Extended Emergency Contact Information Primary Emergency Contact: Sunga,Nancy Address: [address]          [place] [address] [place] Phone: [phone] Relation: Spouse  Code Status:   Allergies: Primaxin  Chief Complaint  Patient presents with  . Medical Management of Chronic Issues    HPI: Patient is 70 y.o. male eaton-lambert syndrome and RA who is being seen for routine issues ofCHF, RA and BPH.  Past Medical History  Diagnosis Date  . Coronary artery disease   . Diabetes mellitus without complication ([place])   . Rheumatoid arthritis ([place])   . Hyperlipidemia   . BPH (benign prostatic hyperplasia)   . 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 GERD (gastroesophageal reflux disease)     History reviewed. No pertinent past surgical history.    Medication List       This list is accurate as of: 07/02/15 11:59 PM.  Always use your most recent med list.               CALCIUM 500 PO  Take by mouth. Calcium 500 mg tablet 1 po tid     calcium carbonate 500 MG chewable tablet  Commonly known as:  TUMS - dosed in mg elemental calcium  Chew 1 tablet by mouth 3 (three) times daily. For osteoarthritis     celecoxib 200 MG capsule  Commonly known as:  CELEBREX  Take 200 mg by mouth daily. For rheumatoid arthritits     cetirizine 10 MG tablet  Commonly known as:  ZYRTEC  Take 10 mg by mouth daily. For allergies     CHLORASEPTIC SORE THROAT 167 MG/5ML Liqd  Generic drug:  Acetaminophen  Take by mouth. Chloraseptic sore throat spray apply spray to throat every 4 hours as needed     acetaminophen 325 MG tablet  Commonly known as:  TYLENOL  Take 650 mg by mouth every 6 (six) hours as needed for pain. 325 mg caplet administer 2  tablets q6h prn mild pain     DUONEB IN  Inhale into the lungs. duoneb 0.5 mg-'3mg'$ /62m soln inhale 1 vial via HFN every 6 hrs as needed for cough or shortness of breath Ipratropium bromide/albuterol sulfate     fluticasone 50 MCG/ACT nasal spray  Commonly known as:  FLONASE  Place 1 spray into both nostrils daily. For allergic rhinitis     guaifenesin 100 MG/5ML syrup  Commonly known as:  ROBITUSSIN  Take 200 mg by mouth 3 (three) times daily as needed for cough.     HYDROcodone-acetaminophen 5-325 MG tablet  Commonly known as:  NORCO/VICODIN  Take one tablet by mouth every 6 hours as needed for pain     hydrocortisone butyrate 0.1 % Crea cream  Commonly known as:  LUCOID  Apply 1 application topically daily. For contact dermatitis     insulin aspart 100 UNIT/ML injection  Commonly known as:  novoLOG  Inject 4 Units into the skin 3 (three) times daily before meals.     KLOR-CON 10 PO  Take by mouth. klor-con 10 meq tab give 1 tab po daily for potassium supplement. Do not crush     LANTUS SOLOSTAR 100 UNIT/ML Solostar Pen  Generic drug:  Insulin Glargine  Inject 33 Units into the skin. For  type 2 diabetic     LOPID PO  Take 600 mg by mouth 2 (two) times daily. For heart disease     metFORMIN 500 MG tablet  Commonly known as:  GLUCOPHAGE  Give 2 and 1/2 tablet po twice daily     metoprolol tartrate 25 MG tablet  Commonly known as:  LOPRESSOR  Take 25 mg by mouth 2 (two) times daily. For HTN     NITROSTAT 0.4 MG SL tablet  Generic drug:  nitroGLYCERIN  Place 0.4 mg under the tongue every 5 (five) minutes as needed for chest pain.     PERIDEX 0.12 % solution  Generic drug:  chlorhexidine  Use as directed 15 mLs in the mouth or throat 2 (two) times daily. Give 15 ml po swish and spit every night for gingivitis     PLAQUENIL 200 MG tablet  Generic drug:  hydroxychloroquine  Take 200 mg by mouth 2 (two) times daily. For rheumatoid arthritis     ranitidine 150 MG capsule   Commonly known as:  ZANTAC  Take 150 mg by mouth 2 (two) times daily. For GERD     tamsulosin 0.4 MG Caps capsule  Commonly known as:  FLOMAX  Take by mouth. Administer 1 capsule po at hs for BPH (Tamsulosin HCL)     THERA Tabs  Take by mouth. Give 1 tablet by mouth daily. multivitains therapeutic     torsemide 5 MG tablet  Commonly known as:  DEMADEX  Give 1 and 1/2 tablet 7.5 mg po daily for pleural effusion (torsemide)     VITAMIN D-3 PO  Take by mouth. 50,000 units caps give 1 capsule po every month on 15th. (cholecalciferol)        No orders of the defined types were placed in this encounter.    Immunization History  Administered Date(s) Administered  . Influenza Whole 02/19/2013  . Influenza-Unspecified 02/26/2014  . PPD Test 03/05/2009  . Pneumococcal-Unspecified 03/17/2010    Social History  Substance Use Topics  . Smoking status: Former Smoker -- 0.25 packs/day for 50 years    Types: Cigarettes    Quit date: 03/19/2013  . Smokeless tobacco: Never Used  . Alcohol Use: No    Review of Systems  DATA OBTAINED: from patient, nurse GENERAL:  no fevers, fatigue, appetite changes SKIN: No itching, rash HEENT: No complaint RESPIRATORY: No cough, wheezing, SOB CARDIAC: No chest pain, palpitations, lower extremity edema  GI: No abdominal pain, No N/V/D or constipation, No heartburn or reflux  GU: No dysuria, frequency or urgency, or incontinence  MUSCULOSKELETAL: No unrelieved bone/joint pain NEUROLOGIC: No headache, dizziness  PSYCHIATRIC: No overt anxiety or sadness  Filed Vitals:   07/06/15 1036  BP: 116/60  Pulse: 70  Temp: 97.6 F (36.4 C)  Resp: 18    Physical Exam  GENERAL APPEARANCE: Alert, conversant, No acute distress  SKIN: No diaphoresis rash HEENT: Unremarkable RESPIRATORY: Breathing is even, unlabored. Lung sounds are clear   CARDIOVASCULAR: Heart RRR no murmurs, rubs or gallops. No peripheral edema  GASTROINTESTINAL: Abdomen is  soft, non-tender, not distended w/ normal bowel sounds.  GENITOURINARY: Bladder non tender, not distended  MUSCULOSKELETAL: No abnormal joints or musculature NEUROLOGIC: Cranial nerves 2-12 grossly intact. Moves all extremities UE > LE PSYCHIATRIC: Mood and affect appropriate to situation, no behavioral issues  Patient Active Problem List   Diagnosis Date Noted  . Aspiration pneumonia ([place]) 04/23/2015  . UTI (urinary tract infection) 10/26/2014  . PVD (peripheral vascular disease) (  [place]) 10/26/2014  . OA (osteoarthritis) 07/20/2014  . Duodenal ulcer 07/20/2014  . CHF (congestive heart failure) ([place]) 07/20/2014  . Pleural effusion 07/20/2014  . AR (allergic rhinitis) 07/20/2014  . Insulin use (long-term) in type 2 diabetes ([place]) 07/20/2014  . Dysphagia, oropharyngeal 06/22/2014  . Anemia of chronic disease 02/15/2014  . Hypertensive heart disease with CHF (congestive heart failure) ([place]) 02/15/2014  . PNA (pneumonia) 02/15/2014  . Chronic diarrhea 02/15/2014  . DM (diabetes mellitus), type 2 with peripheral vascular complications ([place]) 03/18/1116  . GERD (gastroesophageal reflux disease)   . Rheumatoid arthritis ([place])   . Hyperlipidemia   . BPH (benign prostatic hyperplasia)   . 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 Pulmonary infiltrates 12/22/2012  . COPD  undetermined GOLD status 12/22/2012  . Smoker 12/22/2012    CBC    Component Value Date/Time   WBC 4.2 08/16/2014   WBC 8.3 03/21/2013 0932   RBC 4.73 03/21/2013 0932   HGB 12.9* 08/16/2014   HCT 40* 08/16/2014   PLT 389 08/16/2014   MCV 81.6 03/21/2013 0932    CMP     Component Value Date/Time   NA 137 08/16/2014   K 3.9 08/16/2014   BUN 19 08/16/2014   CREATININE 0.6 08/16/2014   AST 12* 08/16/2014   ALT 8* 08/16/2014   ALKPHOS 113 08/16/2014    Assessment and Plan  CHF (congestive heart failure) Pt continues to be stable on lopressor and demadex;plan - cont current regimen  Rheumatoid arthritis Symptoms  are controlled on palquenil and celebrex; pt is out of room daily; plan - cont current meds  BPH (benign prostatic hyperplasia) No UTIs known sinc eon flimax;plan - cont flomax    Hennie Duos, MD

## 2015-07-06 NOTE — Assessment & Plan Note (Signed)
Pt continues to be stable on lopressor and demadex;plan - cont current regimen

## 2015-07-11 LAB — BASIC METABOLIC PANEL
BUN: 21 mg/dL (ref 4–21)
CREATININE: 0.7 mg/dL (ref <=1.3)
GLUCOSE: 138 mg/dL
Potassium: 4.6 mmol/L (ref 3.4–5.3)
Sodium: 140 mmol/L (ref 137–147)

## 2015-07-15 LAB — HM DIABETES EYE EXAM

## 2015-08-12 ENCOUNTER — Encounter: Payer: Self-pay | Admitting: Internal Medicine

## 2015-08-12 ENCOUNTER — Non-Acute Institutional Stay (SKILLED_NURSING_FACILITY): Payer: Medicare Other | Admitting: Internal Medicine

## 2015-08-12 ENCOUNTER — Encounter: Payer: Self-pay | Admitting: *Deleted

## 2015-08-12 DIAGNOSIS — M150 Primary generalized (osteo)arthritis: Secondary | ICD-10-CM | POA: Diagnosis not present

## 2015-08-12 DIAGNOSIS — I110 Hypertensive heart disease with heart failure: Secondary | ICD-10-CM

## 2015-08-12 DIAGNOSIS — E785 Hyperlipidemia, unspecified: Secondary | ICD-10-CM

## 2015-08-12 DIAGNOSIS — M159 Polyosteoarthritis, unspecified: Secondary | ICD-10-CM

## 2015-08-12 NOTE — Assessment & Plan Note (Signed)
Controlled on lopid -in2/2017 LDL70 and HDL 24; will monitor at intervals

## 2015-08-12 NOTE — Assessment & Plan Note (Signed)
Chronic and ongoing, in setting of RA;uses very little for pain control; pt on tylenol prn, celebrex daily and pain is controlled

## 2015-08-12 NOTE — Assessment & Plan Note (Signed)
Stable and controlled on metoprolol and demadex;plan - cont current meds; check lytes at intervals

## 2015-08-12 NOTE — Progress Notes (Signed)
MRN: [id number] Name: Justin Clements  Sex: male Age: 70 y.o. DOB: Sep 20, 1945  [place] [facility]: [facility] Facility/Room: Level Of Care: SNF Provider: [name] D Emergency Contacts: Extended Emergency Contact Information Primary Emergency Contact: Mckinzie,Nancy Address: [address]          [place] [address] [place] Phone: [phone] Relation: Spouse  Code Status:   Allergies: Primaxin  Chief Complaint  Patient presents with  . Medical Management of Chronic Issues    HPI: Patient is 70 y.o. male who is being seen for routine issues of OA, HLD and HTN.  Past Medical History  Diagnosis Date  . Coronary artery disease   . Diabetes mellitus without complication ([place])   . Rheumatoid arthritis ([place])   . Hyperlipidemia   . BPH (benign prostatic hyperplasia)   . 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 GERD (gastroesophageal reflux disease)     History reviewed. No pertinent past surgical history.    Medication List       This list is accurate as of: 08/12/15  8:27 PM.  Always use your most recent med list.               CALCIUM 500 PO  Take by mouth. Calcium 500 mg tablet 1 po tid     calcium carbonate 500 MG chewable tablet  Commonly known as:  TUMS - dosed in mg elemental calcium  Chew 1 tablet by mouth 3 (three) times daily. For osteoarthritis     celecoxib 200 MG capsule  Commonly known as:  CELEBREX  Take 200 mg by mouth daily. For rheumatoid arthritits     cetirizine 10 MG tablet  Commonly known as:  ZYRTEC  Take 10 mg by mouth daily. For allergies     CHLORASEPTIC SORE THROAT 167 MG/5ML Liqd  Generic drug:  Acetaminophen  Take by mouth. Chloraseptic sore throat spray apply spray to throat every 4 hours as needed     acetaminophen 325 MG tablet  Commonly known as:  TYLENOL  Take 650 mg by mouth every 6 (six) hours as needed for pain. 325 mg caplet administer 2 tablets q6h prn mild pain      DUONEB IN  Inhale into the lungs. duoneb 0.5 mg-'3mg'$ /44m soln inhale 1 vial via HFN every 6 hrs as needed for cough or shortness of breath Ipratropium bromide/albuterol sulfate     fluticasone 50 MCG/ACT nasal spray  Commonly known as:  FLONASE  Place 1 spray into both nostrils daily. For allergic rhinitis     guaifenesin 100 MG/5ML syrup  Commonly known as:  ROBITUSSIN  Take 200 mg by mouth 3 (three) times daily as needed for cough.     HYDROcodone-acetaminophen 5-325 MG tablet  Commonly known as:  NORCO/VICODIN  Take one tablet by mouth every 6 hours as needed for pain     hydrocortisone butyrate 0.1 % Crea cream  Commonly known as:  LUCOID  Apply 1 application topically daily. For contact dermatitis     insulin aspart 100 UNIT/ML injection  Commonly known as:  novoLOG  Inject 4 Units into the skin 3 (three) times daily before meals.     KLOR-CON 10 PO  Take by mouth. klor-con 10 meq tab give 1 tab po daily for potassium supplement. Do not crush     LANTUS SOLOSTAR 100 UNIT/ML Solostar Pen  Generic drug:  Insulin Glargine  Inject 33 Units into the skin. For type 2  diabetic     LOPID PO  Take 600 mg by mouth 2 (two) times daily. For heart disease     metFORMIN 500 MG tablet  Commonly known as:  GLUCOPHAGE  Give 2 and 1/2 tablet po twice daily     metoprolol tartrate 25 MG tablet  Commonly known as:  LOPRESSOR  Take 25 mg by mouth 2 (two) times daily. For HTN     NITROSTAT 0.4 MG SL tablet  Generic drug:  nitroGLYCERIN  Place 0.4 mg under the tongue every 5 (five) minutes as needed for chest pain.     PERIDEX 0.12 % solution  Generic drug:  chlorhexidine  Use as directed 15 mLs in the mouth or throat 2 (two) times daily. Give 15 ml po swish and spit every night for gingivitis     PLAQUENIL 200 MG tablet  Generic drug:  hydroxychloroquine  Take 200 mg by mouth 2 (two) times daily. For rheumatoid arthritis     ranitidine 150 MG capsule  Commonly known as:  ZANTAC   Take 150 mg by mouth 2 (two) times daily. For GERD     tamsulosin 0.4 MG Caps capsule  Commonly known as:  FLOMAX  Take by mouth. Administer 1 capsule po at hs for BPH (Tamsulosin HCL)     THERA Tabs  Take by mouth. Give 1 tablet by mouth daily. multivitains therapeutic     torsemide 5 MG tablet  Commonly known as:  DEMADEX  Give 1 and 1/2 tablet 7.5 mg po daily for pleural effusion (torsemide)     VITAMIN D-3 PO  Take by mouth. 50,000 units caps give 1 capsule po every month on 15th. (cholecalciferol)        No orders of the defined types were placed in this encounter.    Immunization History  Administered Date(s) Administered  . Influenza Whole 02/19/2013  . Influenza-Unspecified 02/26/2014, 02/10/2015  . PPD Test 03/05/2009  . Pneumococcal-Unspecified 03/17/2010    Social History  Substance Use Topics  . Smoking status: Former Smoker -- 0.25 packs/day for 50 years    Types: Cigarettes    Quit date: 03/19/2013  . Smokeless tobacco: Never Used  . Alcohol Use: No    Review of Systems  DATA OBTAINED: from patient- no c/o, rarely has c/o GENERAL:  no fevers, fatigue, appetite changes SKIN: No itching, rash HEENT: No complaint RESPIRATORY: No cough, wheezing, SOB CARDIAC: No chest pain, palpitations, lower extremity edema  GI: No abdominal pain, No N/V/D or constipation, No heartburn or reflux  GU: No dysuria, frequency or urgency, or incontinence  MUSCULOSKELETAL: No unrelieved bone/joint pain NEUROLOGIC: No headache, dizziness  PSYCHIATRIC: No overt anxiety or sadness  Filed Vitals:   08/12/15 2013  BP: 116/86  Pulse: 86  Temp: 98.9 F (37.2 C)  Resp: 16    Physical Exam  GENERAL APPEARANCE: Alert, conversant, No acute distress ;propels in electric chair in halls daily SKIN: No diaphoresis rash,  HEENT: Unremarkable RESPIRATORY: Breathing is even, unlabored. Lung sounds are clear   CARDIOVASCULAR: Heart RRR no murmurs, rubs or gallops. No  peripheral edema  GASTROINTESTINAL: Abdomen is soft, non-tender, not distended w/ normal bowel sounds.  GENITOURINARY: Bladder non tender, not distended  MUSCULOSKELETAL: RA nodules hands ,legs knees NEUROLOGIC: Cranial nerves 2-12 grossly intact. Moves all extremities will limited ROM, BLE braces, ataxic BUE but does well PSYCHIATRIC: Mood and affect appropriate to situation, no behavioral issues  Patient Active Problem List   Diagnosis Date Noted  .  Aspiration pneumonia ([place]) 04/23/2015  . UTI (urinary tract infection) 10/26/2014  . PVD (peripheral vascular disease) ([place]) 10/26/2014  . OA (osteoarthritis) 07/20/2014  . Duodenal ulcer 07/20/2014  . CHF (congestive heart failure) ([place]) 07/20/2014  . Pleural effusion 07/20/2014  . AR (allergic rhinitis) 07/20/2014  . Insulin use (long-term) in type 2 diabetes ([place]) 07/20/2014  . Dysphagia, oropharyngeal 06/22/2014  . Anemia of chronic disease 02/15/2014  . Hypertensive heart disease with CHF (congestive heart failure) ([place]) 02/15/2014  . PNA (pneumonia) 02/15/2014  . Chronic diarrhea 02/15/2014  . DM (diabetes mellitus), type 2 with peripheral vascular complications ([place]) 25/95/6387  . GERD (gastroesophageal reflux disease)   . Rheumatoid arthritis ([place])   . Hyperlipidemia   . BPH (benign prostatic hyperplasia)   . 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 Pulmonary infiltrates 12/22/2012  . COPD  undetermined GOLD status 12/22/2012  . Smoker 12/22/2012    CBC    Component Value Date/Time   WBC 3.3 06/24/2015   WBC 8.3 03/21/2013 0932   RBC 4.73 03/21/2013 0932   HGB 13.2* 06/24/2015   HCT 41 06/24/2015   PLT 345 06/24/2015   MCV 81.6 03/21/2013 0932    CMP     Component Value Date/Time   NA 140 07/11/2015   K 4.6 07/11/2015   BUN 21 07/11/2015   CREATININE 0.7 07/11/2015   AST 15 06/24/2015   ALT 8* 06/24/2015   ALKPHOS 102 06/24/2015    Assessment and Plan  OA (osteoarthritis) Chronic and ongoing, in setting of  RA;uses very little for pain control; pt on tylenol prn, celebrex daily and pain is controlled  Hyperlipidemia Controlled on lopid -in2/2017 LDL70 and HDL 24; will monitor at intervals  Hypertensive heart disease with CHF (congestive heart failure) Stable and controlled on metoprolol and demadex;plan - cont current meds; check lytes at intervals    Hennie Duos, MD

## 2015-09-19 ENCOUNTER — Encounter: Payer: Self-pay | Admitting: Internal Medicine

## 2015-09-19 ENCOUNTER — Non-Acute Institutional Stay (SKILLED_NURSING_FACILITY): Payer: Medicare Other | Admitting: Internal Medicine

## 2015-09-19 DIAGNOSIS — J690 Pneumonitis due to inhalation of food and vomit: Secondary | ICD-10-CM | POA: Diagnosis not present

## 2015-09-19 DIAGNOSIS — G259 Extrapyramidal and movement disorder, unspecified: Secondary | ICD-10-CM | POA: Diagnosis not present

## 2015-09-19 NOTE — Progress Notes (Signed)
MRN: [id number] Name: Justin Clements  Sex: male Age: 70 y.o. DOB: 1945-06-01  [place] #: [facility] Facility/Room: 644 W Level Of Care: SNF Provider: Hennie Duos Emergency Contacts: Extended Emergency Contact Information Primary Emergency Contact: Casebolt,Nancy Address: [address]          [place] [address] [place] Phone: [phone] Relation: Spouse  Code Status:   Allergies: Primaxin  Chief Complaint  Patient presents with  . Acute Visit    HPI: Patient is 70 y.o. male who is being seen at the behest of the [employer] nurse. Pt has declined in function over past week, has been noted to be coughing more when he eats. He is now coughing and doesn't look well. No fever and denies SOB but does admit that he feels bad. Pt has also developed a tremor, almost a chorea of BUE. Wife had to hold pt's soup for him to drink. Pt is in the later stages of [name] Syndrome so symptom control is the goal for that.  Past Medical History  Diagnosis Date  . Coronary artery disease   . Diabetes mellitus without complication ([place])   . Rheumatoid arthritis ([place])   . Hyperlipidemia   . BPH (benign prostatic hyperplasia)   . 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 GERD (gastroesophageal reflux disease)     History reviewed. No pertinent past surgical history.    Medication List       This list is accurate as of: 09/19/15 11:59 PM.  Always use your most recent med list.               acetaminophen 325 MG tablet  Commonly known as:  TYLENOL  Take 650 mg by mouth every 6 (six) hours as needed for pain. 325 mg caplet administer 2 tablets q6h prn mild pain     CALCIUM 500 PO  Take by mouth. Calcium 500 mg tablet 1 po tid     celecoxib 200 MG capsule  Commonly known as:  CELEBREX  Take 200 mg by mouth daily. For rheumatoid arthritits     cetirizine 10 MG tablet  Commonly known as:  ZYRTEC  Take 10 mg by mouth daily.  For allergies     ciprofloxacin-dexamethasone otic suspension  Commonly known as:  CIPRODEX  Place 4 drops into the right ear daily. For 7 days. Stop Date: 09/23/15     clonazePAM 0.5 MG tablet  Commonly known as:  KLONOPIN  Take 0.25 mg by mouth at bedtime.     DUONEB IN  Inhale into the lungs. Give after each meal 3 times a day for 7 days. Stop date: 09/26/15     finasteride 5 MG tablet  Commonly known as:  PROSCAR  Take 5 mg by mouth daily.     guaifenesin 100 MG/5ML syrup  Commonly known as:  ROBITUSSIN  Take 200 mg by mouth 3 (three) times daily as needed for cough.     hydrocortisone butyrate 0.1 % Crea cream  Commonly known as:  LUCOID  Apply 1 application topically daily. For contact dermatitis     KLOR-CON 10 PO  Take by mouth. klor-con 10 meq tab give 1 tab po daily for potassium supplement. Do not crush     LANTUS SOLOSTAR 100 UNIT/ML Solostar Pen  Generic drug:  Insulin Glargine  Inject 33 Units into the skin. For type 2 diabetic     levofloxacin 750 MG tablet  Commonly  known as:  LEVAQUIN  Take 750 mg by mouth daily. Stop Date: 09/26/15     loperamide 2 MG capsule  Commonly known as:  IMODIUM  Take 2 mg by mouth as needed for diarrhea or loose stools.     LOPID PO  Take 600 mg by mouth 2 (two) times daily. For heart disease     magnesium oxide 400 MG tablet  Commonly known as:  MAG-OX  Take 400 mg by mouth daily.     metFORMIN 500 MG tablet  Commonly known as:  GLUCOPHAGE  Give 2 and 1/2 tablet po twice daily     metoprolol tartrate 25 MG tablet  Commonly known as:  LOPRESSOR  Take 25 mg by mouth 2 (two) times daily. For HTN     NITROSTAT 0.4 MG SL tablet  Generic drug:  nitroGLYCERIN  Place 0.4 mg under the tongue every 5 (five) minutes as needed for chest pain.     PERIDEX 0.12 % solution  Generic drug:  chlorhexidine  Use as directed 15 mLs in the mouth or throat 2 (two) times daily. Give 15 ml po swish and spit every night for gingivitis      PLAQUENIL 200 MG tablet  Generic drug:  hydroxychloroquine  Take 200 mg by mouth 2 (two) times daily. For rheumatoid arthritis     ranitidine 150 MG capsule  Commonly known as:  ZANTAC  Take 150 mg by mouth 2 (two) times daily. For GERD     tamsulosin 0.4 MG Caps capsule  Commonly known as:  FLOMAX  Take by mouth. Administer 1 capsule po at hs for BPH (Tamsulosin HCL)     THERA Tabs  Take by mouth. Give 1 tablet by mouth daily. multivitains therapeutic     torsemide 5 MG tablet  Commonly known as:  DEMADEX  Give 1 and 1/2 tablet 7.5 mg po daily for pleural effusion (torsemide)     VITAMIN D-3 PO  Take by mouth. 50,000 units caps give 1 capsule po every month on 15th. (cholecalciferol)        Meds ordered this encounter  Medications  . clonazePAM (KLONOPIN) 0.5 MG tablet    Sig: Take 0.25 mg by mouth at bedtime.  Marland Kitchen levofloxacin (LEVAQUIN) 750 MG tablet    Sig: Take 750 mg by mouth daily. Stop Date: 09/26/15  . ciprofloxacin-dexamethasone (CIPRODEX) otic suspension    Sig: Place 4 drops into the right ear daily. For 7 days. Stop Date: 09/23/15  . loperamide (IMODIUM) 2 MG capsule    Sig: Take 2 mg by mouth as needed for diarrhea or loose stools.  . finasteride (PROSCAR) 5 MG tablet    Sig: Take 5 mg by mouth daily.  . magnesium oxide (MAG-OX) 400 MG tablet    Sig: Take 400 mg by mouth daily.    Immunization History  Administered Date(s) Administered  . Influenza Whole 02/19/2013  . Influenza-Unspecified 02/26/2014, 02/10/2015  . PPD Test 03/05/2009  . Pneumococcal-Unspecified 03/17/2010    Social History  Substance Use Topics  . Smoking status: Former Smoker -- 0.25 packs/day for 50 years    Types: Cigarettes    Quit date: 03/19/2013  . Smokeless tobacco: Never Used  . Alcohol Use: No    Review of Systems  DATA OBTAINED: from patient, nurse,wife GENERAL:  no fevers, fatigue, appetite changes SKIN: No itching, rash HEENT: No complaint RESPIRATORY: +cough, no  wheezing, no SOB CARDIAC: No chest pain, palpitations, lower extremity edema  GI: No  abdominal pain, No N/V/D or constipation, No heartburn or reflux  GU: No dysuria, frequency or urgency, or incontinence  MUSCULOSKELETAL: No unrelieved bone/joint pain NEUROLOGIC: No headache, dizziness; increased tremor  PSYCHIATRIC: No overt anxiety or sadness  Filed Vitals:   09/19/15 1619  BP: 139/70  Pulse: 85  Temp: 99.5 F (37.5 C)  Resp: 18    Physical Exam  GENERAL APPEARANCE: Alert, conversant, No acute distress  SKIN: No diaphoresis rash, mild pallor HEENT: Unremarkable RESPIRATORY: Breathing is even, unlabored. Lung sounds are diffusely decreased  CARDIOVASCULAR: Heart RRR no murmurs, rubs or gallops. No peripheral edema  GASTROINTESTINAL: Abdomen is soft, non-tender, not distended w/ normal bowel sounds.  GENITOURINARY: Bladder non tender, not distended  MUSCULOSKELETAL: contractures NEUROLOGIC: Cranial nerves 2-12 grossly intact; large amplitude tremor BUE PSYCHIATRIC: Mood and affect appropriate to situation, no behavioral issues  Patient Active Problem List   Diagnosis Date Noted  . Movement disorder 09/20/2015  . Aspiration pneumonia ([place]) 04/23/2015  . UTI (urinary tract infection) 10/26/2014  . PVD (peripheral vascular disease) ([place]) 10/26/2014  . OA (osteoarthritis) 07/20/2014  . Duodenal ulcer 07/20/2014  . CHF (congestive heart failure) ([place]) 07/20/2014  . Pleural effusion 07/20/2014  . AR (allergic rhinitis) 07/20/2014  . Insulin use (long-term) in type 2 diabetes ([place]) 07/20/2014  . Dysphagia, oropharyngeal 06/22/2014  . Anemia of chronic disease 02/15/2014  . Hypertensive heart disease with CHF (congestive heart failure) ([place]) 02/15/2014  . PNA (pneumonia) 02/15/2014  . Chronic diarrhea 02/15/2014  . DM (diabetes mellitus), type 2 with peripheral vascular complications ([place]) 74/04/8785  . GERD (gastroesophageal reflux disease)   . Rheumatoid arthritis ([place])    . Hyperlipidemia   . BPH (benign prostatic hyperplasia)   . 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 Pulmonary infiltrates 12/22/2012  . COPD  undetermined GOLD status 12/22/2012  . Smoker 12/22/2012    CBC    Component Value Date/Time   WBC 3.3 06/24/2015   WBC 8.3 03/21/2013 0932   RBC 4.73 03/21/2013 0932   HGB 13.2* 06/24/2015   HCT 41 06/24/2015   PLT 345 06/24/2015   MCV 81.6 03/21/2013 0932    CMP     Component Value Date/Time   NA 140 07/11/2015   K 4.6 07/11/2015   BUN 21 07/11/2015   CREATININE 0.7 07/11/2015   AST 15 06/24/2015   ALT 8* 06/24/2015   ALKPHOS 102 06/24/2015    Assessment and Plan  Aspiration pneumonia ([place]) Pt appears to have aspiration PNA. He has been started on levaquin 750 mg daily for 7 days, with scheduled duonebs and guafinisin. I spoke with DON and Harley and pt has a dysphagia waiver. It is time to start talking about future care as the prediction is that pt will continue developing. I did ask pt if he needed to go to the hospital would he want to and he said yes.  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 Prior he had not wanted anything to make him sleepy but he is willing to try clonazepam 0.25 mg qhs.if no problems we will inc to BID and up doseage if necessary   Time spent > 35 min;> 50% of time with patient was spent reviewing records, labs, tests and studies, counseling and developing plan of care  [name], MD

## 2015-09-20 ENCOUNTER — Encounter: Payer: Self-pay | Admitting: Internal Medicine

## 2015-09-20 DIAGNOSIS — G259 Extrapyramidal and movement disorder, unspecified: Secondary | ICD-10-CM | POA: Insufficient documentation

## 2015-09-20 NOTE — Assessment & Plan Note (Addendum)
Of upper extremities., related to his eaton lambert syndrome. Prior he had not wanted anything to make him sleepy but he is willing to try clonazepam 0.25 mg qhs.if no problems we will inc to BID and up doseage if necessary

## 2015-09-20 NOTE — Assessment & Plan Note (Signed)
Pt appears to have aspiration PNA. He has been started on levaquin 750 mg daily for 7 days, with scheduled duonebs and guafinisin. I spoke with DON and Harley and pt has a dysphagia waiver. It is time to start talking about future care as the prediction is that pt will continue developing. I did ask pt if he needed to go to the hospital would he want to and he said yes.

## 2015-11-05 LAB — HM DIABETES FOOT EXAM

## 2015-11-07 ENCOUNTER — Non-Acute Institutional Stay (SKILLED_NURSING_FACILITY): Payer: Medicare Other | Admitting: Internal Medicine

## 2015-11-07 ENCOUNTER — Encounter: Payer: Self-pay | Admitting: Internal Medicine

## 2015-11-07 DIAGNOSIS — E785 Hyperlipidemia, unspecified: Secondary | ICD-10-CM

## 2015-11-07 DIAGNOSIS — I110 Hypertensive heart disease with heart failure: Secondary | ICD-10-CM | POA: Diagnosis not present

## 2015-11-07 DIAGNOSIS — M150 Primary generalized (osteo)arthritis: Secondary | ICD-10-CM

## 2015-11-07 DIAGNOSIS — M159 Polyosteoarthritis, unspecified: Secondary | ICD-10-CM

## 2015-11-07 NOTE — Progress Notes (Signed)
MRN: [id number] Name: Justin Clements  Sex: male Age: 70 y.o. DOB: June 12, 1945  [place] #:  Facility/Room: [facility] Level Of Care: SNF Provider:Sho Salguero MD Emergency Contacts: Extended Emergency Contact Information Primary Emergency Contact: Reichelt,Nancy Address: [address]          [place] [address] [place] Phone: [phone] Relation: Spouse  Code Status:DNR   Allergies: Primaxin  Chief Complaint  Patient presents with  . Medical Management of Chronic Issues    HPI: Patient is 70 y.o. male who is beng seen for routine issues of HTN, OA and HLD.  Past Medical History  Diagnosis Date  . Coronary artery disease   . Diabetes mellitus without complication ([place])   . Rheumatoid arthritis ([place])   . Hyperlipidemia   . BPH (benign prostatic hyperplasia)   . 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 GERD (gastroesophageal reflux disease)     History reviewed. No pertinent past surgical history.    Medication List       This list is accurate as of: 11/07/15 11:59 PM.  Always use your most recent med list.               acetaminophen 325 MG tablet  Commonly known as:  TYLENOL  Take 650 mg by mouth every 6 (six) hours as needed for pain. 325 mg caplet administer 2 tablets q6h prn mild pain     CALCIUM 500 PO  Take by mouth. Calcium 500 mg tablet 1 po tid     celecoxib 200 MG capsule  Commonly known as:  CELEBREX  Take 200 mg by mouth daily. For rheumatoid arthritits     cetirizine 10 MG tablet  Commonly known as:  ZYRTEC  Take 10 mg by mouth daily. For allergies     clonazePAM 0.5 MG tablet  Commonly known as:  KLONOPIN  Take 0.25 mg by mouth at bedtime.     finasteride 5 MG tablet  Commonly known as:  PROSCAR  Take 5 mg by mouth daily.     guaifenesin 100 MG/5ML syrup  Commonly known as:  ROBITUSSIN  Take 200 mg by mouth 3 (three) times daily as needed for cough.     hydrocortisone  butyrate 0.1 % Crea cream  Commonly known as:  LUCOID  Apply 1 application topically daily. For contact dermatitis     KLOR-CON 10 PO  Take by mouth. klor-con 10 meq tab give 1 tab po daily for potassium supplement. Do not crush     LANTUS SOLOSTAR 100 UNIT/ML Solostar Pen  Generic drug:  Insulin Glargine  Inject 33 Units into the skin. For type 2 diabetic     LOPID PO  Take 600 mg by mouth 2 (two) times daily. For heart disease     magnesium oxide 400 MG tablet  Commonly known as:  MAG-OX  Take 400 mg by mouth daily.     metFORMIN 500 MG tablet  Commonly known as:  GLUCOPHAGE  Give 2 and 1/2 tablet po twice daily     metoprolol tartrate 25 MG tablet  Commonly known as:  LOPRESSOR  Take 25 mg by mouth 2 (two) times daily. For HTN     NITROSTAT 0.4 MG SL tablet  Generic drug:  nitroGLYCERIN  Place 0.4 mg under the tongue every 5 (five) minutes as needed for chest pain.     PERIDEX 0.12 % solution  Generic drug:  chlorhexidine  Use as directed  15 mLs in the mouth or throat 2 (two) times daily. Give 15 ml po swish and spit every night for gingivitis     PLAQUENIL 200 MG tablet  Generic drug:  hydroxychloroquine  Take 200 mg by mouth 2 (two) times daily. For rheumatoid arthritis     ranitidine 150 MG capsule  Commonly known as:  ZANTAC  Take 150 mg by mouth 2 (two) times daily. For GERD     tamsulosin 0.4 MG Caps capsule  Commonly known as:  FLOMAX  Take by mouth. Administer 1 capsule po at hs for BPH (Tamsulosin HCL)     THERA Tabs  Take by mouth. Give 1 tablet by mouth daily. multivitains therapeutic     torsemide 5 MG tablet  Commonly known as:  DEMADEX  Give 1 and 1/2 tablet 7.5 mg po daily for pleural effusion (torsemide)     VITAMIN D-3 PO  Take by mouth. 50,000 units caps give 1 capsule po every month on 15th. (cholecalciferol)        No orders of the defined types were placed in this encounter.    Immunization History  Administered Date(s)  Administered  . Influenza Whole 02/19/2013  . Influenza-Unspecified 02/26/2014, 02/10/2015  . PPD Test 03/05/2009  . Pneumococcal-Unspecified 03/17/2010    Social History  Substance Use Topics  . Smoking status: Former Smoker -- 0.25 packs/day for 50 years    Types: Cigarettes    Quit date: 03/19/2013  . Smokeless tobacco: Never Used  . Alcohol Use: No    Review of Systems  DATA OBTAINED: from patient GENERAL:  no fevers, fatigue, appetite changes SKIN: No itching, rash HEENT: No complaint RESPIRATORY: No cough, wheezing, SOB CARDIAC: No chest pain, palpitations, lower extremity edema  GI: No abdominal pain, No N/V/D or constipation, No heartburn or reflux  GU: No dysuria, frequency or urgency, or incontinence  MUSCULOSKELETAL: No unrelieved bone/joint pain NEUROLOGIC: No headache, dizziness  PSYCHIATRIC: No overt anxiety or sadness  Filed Vitals:   11/07/15 1000  BP: 128/74  Pulse: 66  Temp: 98.5 F (36.9 C)  Resp: 18    Physical Exam  GENERAL APPEARANCE: Alert, conversant, No acute distress  SKIN: No diaphoresis rash HEENT: Unremarkable RESPIRATORY: Breathing is even, unlabored. Lung sounds are clear   CARDIOVASCULAR: Heart RRR no murmurs, rubs or gallops. No peripheral edema  GASTROINTESTINAL: Abdomen is soft, non-tender, not distended w/ normal bowel sounds.  GENITOURINARY: Bladder non tender, not distended  MUSCULOSKELETAL: No abnormal joints or musculature NEUROLOGIC: Cranial nerves 2-12 grossly intact; BLE weakness and ataxia and now with BUE tremors PSYCHIATRIC: Mood and affect appropriate to situation, no behavioral issues  Patient Active Problem List   Diagnosis Date Noted  . Movement disorder 09/20/2015  . Aspiration pneumonia ([place]) 04/23/2015  . UTI (urinary tract infection) 10/26/2014  . PVD (peripheral vascular disease) ([place]) 10/26/2014  . OA (osteoarthritis) 07/20/2014  . Duodenal ulcer 07/20/2014  . CHF (congestive heart failure) ([place])  07/20/2014  . Pleural effusion 07/20/2014  . AR (allergic rhinitis) 07/20/2014  . Insulin use (long-term) in type 2 diabetes ([place]) 07/20/2014  . Dysphagia, oropharyngeal 06/22/2014  . Anemia of chronic disease 02/15/2014  . Hypertensive heart disease with CHF (congestive heart failure) ([place]) 02/15/2014  . PNA (pneumonia) 02/15/2014  . Chronic diarrhea 02/15/2014  . DM (diabetes mellitus), type 2 with peripheral vascular complications ([place]) 95/63/8756  . GERD (gastroesophageal reflux disease)   . Rheumatoid arthritis ([place])   . Hyperlipidemia   . BPH (benign prostatic  hyperplasia)   . 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 Pulmonary infiltrates 12/22/2012  . COPD  undetermined GOLD status 12/22/2012  . Smoker 12/22/2012    CBC    Component Value Date/Time   WBC 3.3 06/24/2015   WBC 8.3 03/21/2013 0932   RBC 4.73 03/21/2013 0932   HGB 13.2* 06/24/2015   HCT 41 06/24/2015   PLT 345 06/24/2015   MCV 81.6 03/21/2013 0932    CMP     Component Value Date/Time   NA 140 07/11/2015   K 4.6 07/11/2015   BUN 21 07/11/2015   CREATININE 0.7 07/11/2015   AST 15 06/24/2015   ALT 8* 06/24/2015   ALKPHOS 102 06/24/2015    Assessment and Plan  OA (osteoarthritis) Chronic and ongoing, in setting of RA;uses very little for pain control; pt on tylenol prn, celebrex daily and pain is controlled  Hyperlipidemia Controlled on lopid -in2/2017 LDL70 and HDL 24; will monitor at intervals  Hypertensive heart disease with CHF (congestive heart failure) Stable and controlled on metoprolol and demadex;plan - cont current meds; check lytes at intervals    [name]  MD

## 2015-11-09 ENCOUNTER — Encounter: Payer: Self-pay | Admitting: Internal Medicine

## 2015-11-28 LAB — LIPID PANEL
Cholesterol: 123 mg/dL (ref 0–200)
HDL: 28 mg/dL — AB (ref 35–70)
LDL CALC: 59 mg/dL
TRIGLYCERIDES: 178 mg/dL — AB (ref 40–160)

## 2015-11-28 LAB — HEMOGLOBIN A1C: Hemoglobin A1C: 7.4

## 2015-12-05 ENCOUNTER — Encounter: Payer: Self-pay | Admitting: Internal Medicine

## 2015-12-05 ENCOUNTER — Non-Acute Institutional Stay (SKILLED_NURSING_FACILITY): Payer: Medicare Other | Admitting: Internal Medicine

## 2015-12-05 DIAGNOSIS — G259 Extrapyramidal and movement disorder, unspecified: Secondary | ICD-10-CM | POA: Diagnosis not present

## 2015-12-05 DIAGNOSIS — M0579 Rheumatoid arthritis with rheumatoid factor of multiple sites without organ or systems involvement: Secondary | ICD-10-CM

## 2015-12-05 DIAGNOSIS — N400 Enlarged prostate without lower urinary tract symptoms: Secondary | ICD-10-CM | POA: Diagnosis not present

## 2015-12-05 NOTE — Progress Notes (Signed)
MRN: [id number] Name: Justin Clements  Sex: male Age: 70 y.o. DOB: 23-Mar-1946  [place] #:  Facility/Room: [facility] / [address] Level Of Care: SNF Provider: Noah Delaine. [facility] Contacts: Extended Emergency Contact Information Primary Emergency Contact: Mooers,Nancy Address: [address]          [place] [address] [place] Phone: [phone] Relation: Spouse  Code Status: DNR  Allergies: Primaxin  Chief Complaint  Patient presents with  . Medical Management of Chronic Issues    Routine Visit    HPI: Patient is 70 y.o. male who is being seen for routine isues of movement disorder, RA and BPH.  Past Medical History  Diagnosis Date  . Coronary artery disease   . Diabetes mellitus without complication ([place])   . Rheumatoid arthritis ([place])   . Hyperlipidemia   . BPH (benign prostatic hyperplasia)   . 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 GERD (gastroesophageal reflux disease)     No past surgical history on file.    Medication List       This list is accurate as of: 12/05/15  8:36 PM.  Always use your most recent med list.               acetaminophen 325 MG tablet  Commonly known as:  TYLENOL  Take 650 mg by mouth every 6 (six) hours as needed for mild pain. 325 mg caplet administer 2 tablets q6h prn for temp greater than 99.5     CALCIUM 500 PO  Take 1 tablet by mouth 3 (three) times daily.     celecoxib 200 MG capsule  Commonly known as:  CELEBREX  Take 200 mg by mouth daily. For rheumatoid arthritits     cetirizine 10 MG tablet  Commonly known as:  ZYRTEC  Take 10 mg by mouth daily. For allergies     clonazePAM 0.5 MG tablet  Commonly known as:  KLONOPIN  Take 0.25 mg by mouth at bedtime.     finasteride 5 MG tablet  Commonly known as:  PROSCAR  Take 5 mg by mouth daily.     hydrocortisone butyrate 0.1 % Crea cream  Commonly known as:  LUCOID  Apply 1 application topically daily. To  bilateral arms For contact dermatitis     KLOR-CON 10 PO  Take 1 tablet by mouth daily. For potassium supplement     LANTUS SOLOSTAR 100 UNIT/ML Solostar Pen  Generic drug:  Insulin Glargine  Inject 33 Units into the skin every morning. Also inject 33 units sq at bedtime     LOPID PO  Take 600 mg by mouth 2 (two) times daily. For heart disease     magnesium oxide 400 MG tablet  Commonly known as:  MAG-OX  Take 400 mg by mouth daily.     metFORMIN 500 MG tablet  Commonly known as:  GLUCOPHAGE  Give 2 and 1/2 tablet po twice daily     metoprolol tartrate 25 MG tablet  Commonly known as:  LOPRESSOR  Take 25 mg by mouth 2 (two) times daily. For HTN     NITROSTAT 0.4 MG SL tablet  Generic drug:  nitroGLYCERIN  Place 0.4 mg under the tongue every 5 (five) minutes as needed for chest pain.     PERIDEX 0.12 % solution  Generic drug:  chlorhexidine  Use as directed 15 mLs in the mouth or throat at bedtime.     PLAQUENIL 200 MG  tablet  Generic drug:  hydroxychloroquine  Take 200 mg by mouth 2 (two) times daily. For rheumatoid arthritis     ranitidine 150 MG capsule  Commonly known as:  ZANTAC  Take 150 mg by mouth 2 (two) times daily. For GERD     tamsulosin 0.4 MG Caps capsule  Commonly known as:  FLOMAX  Take 0.8 mg by mouth at bedtime.     THERA Tabs  Take by mouth. Give 1 tablet by mouth daily. multivitains therapeutic     torsemide 5 MG tablet  Commonly known as:  DEMADEX  Give 1 and 1/2 tablet 7.5 mg po daily for pleural effusion (torsemide)     VITAMIN D-3 PO  Take by mouth. 50,000 units caps give 1 capsule po every month on 15th. (cholecalciferol)        No orders of the defined types were placed in this encounter.    Immunization History  Administered Date(s) Administered  . Influenza Whole 02/19/2013  . Influenza-Unspecified 02/26/2014, 02/10/2015  . PPD Test 03/05/2009  . Pneumococcal-Unspecified 03/17/2010    Social History  Substance Use Topics   . Smoking status: Former Smoker -- 0.25 packs/day for 50 years    Types: Cigarettes    Quit date: 03/19/2013  . Smokeless tobacco: Never Used  . Alcohol Use: No    Review of Systems  DATA OBTAINED: from patient GENERAL:  no fevers, fatigue, appetite changes SKIN: No itching, rash HEENT: No complaint RESPIRATORY: No cough, wheezing, SOB CARDIAC: No chest pain, palpitations, lower extremity edema  GI: No abdominal pain, No N/V/D or constipation, No heartburn or reflux  GU: No dysuria, frequency or urgency, or incontinence  MUSCULOSKELETAL: chronic L shoulder and B knee pain NEUROLOGIC: No headache, dizziness  PSYCHIATRIC: No overt anxiety or sadness  Filed Vitals:   12/05/15 1155  BP: 120/68  Pulse: 75  Temp: 97.9 F (36.6 C)  Resp: 18    Physical Exam  GENERAL APPEARANCE: Alert, conversant, No acute distress  SKIN: No diaphoresis rash HEENT: Unremarkable RESPIRATORY: Breathing is even, unlabored. Lung sounds are clear   CARDIOVASCULAR: Heart RRR no murmurs, rubs or gallops. No peripheral edema  GASTROINTESTINAL: Abdomen is soft, non-tender, not distended w/ normal bowel sounds.  GENITOURINARY: Bladder non tender, not distended  MUSCULOSKELETAL: nodules on hands; braces on B knees NEUROLOGIC: Cranial nerves 2-12 grossly intact. Moves all extremities;UE ataxia much improved PSYCHIATRIC: Mood and affect appropriate to situation, no behavioral issues  Patient Active Problem List   Diagnosis Date Noted  . Movement disorder 09/20/2015  . Aspiration pneumonia ([place]) 04/23/2015  . UTI (urinary tract infection) 10/26/2014  . PVD (peripheral vascular disease) ([place]) 10/26/2014  . OA (osteoarthritis) 07/20/2014  . Duodenal ulcer 07/20/2014  . CHF (congestive heart failure) ([place]) 07/20/2014  . Pleural effusion 07/20/2014  . AR (allergic rhinitis) 07/20/2014  . Insulin use (long-term) in type 2 diabetes ([place]) 07/20/2014  . Dysphagia, oropharyngeal 06/22/2014  . Anemia of  chronic disease 02/15/2014  . Hypertensive heart disease with CHF (congestive heart failure) ([place]) 02/15/2014  . PNA (pneumonia) 02/15/2014  . Chronic diarrhea 02/15/2014  . DM (diabetes mellitus), type 2 with peripheral vascular complications ([place]) 95/62/1308  . GERD (gastroesophageal reflux disease)   . Rheumatoid arthritis ([place])   . Hyperlipidemia   . BPH (benign prostatic hyperplasia)   . 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 Pulmonary infiltrates 12/22/2012  . COPD  undetermined GOLD status 12/22/2012  . Smoker 12/22/2012    CBC    Component  Value Date/Time   WBC 3.3 06/24/2015   WBC 8.3 03/21/2013 0932   RBC 4.73 03/21/2013 0932   HGB 13.2* 06/24/2015   HCT 41 06/24/2015   PLT 345 06/24/2015   MCV 81.6 03/21/2013 0932    CMP     Component Value Date/Time   NA 140 07/11/2015   K 4.6 07/11/2015   BUN 21 07/11/2015   CREATININE 0.7 07/11/2015   AST 15 06/24/2015   ALT 8* 06/24/2015   ALKPHOS 102 06/24/2015    Assessment and Plan  Movement disorder Of upper extremities., related to his eaton lambert syndrome. Prior he had not wanted anything to make him sleepy but he was willing to try clonazepam 0.25 mg qhs. 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 Sheppard Coil, MD

## 2015-12-26 ENCOUNTER — Encounter: Payer: Self-pay | Admitting: Internal Medicine

## 2015-12-26 ENCOUNTER — Non-Acute Institutional Stay (SKILLED_NURSING_FACILITY): Payer: Medicare Other | Admitting: Internal Medicine

## 2015-12-26 DIAGNOSIS — N400 Enlarged prostate without lower urinary tract symptoms: Secondary | ICD-10-CM | POA: Diagnosis not present

## 2015-12-26 DIAGNOSIS — E785 Hyperlipidemia, unspecified: Secondary | ICD-10-CM | POA: Diagnosis not present

## 2015-12-26 DIAGNOSIS — E1151 Type 2 diabetes mellitus with diabetic peripheral angiopathy without gangrene: Secondary | ICD-10-CM

## 2015-12-26 NOTE — Progress Notes (Signed)
MRN: [id number] Name: Justin Clements  Sex: male Age: 70 y.o. DOB: [birth year]  [place] #:  Facility/Room: [facility] / [address] Level Of Care: SNF Provider: Noah Delaine. Sheppard Coil, MD Emergency Contacts: Extended Emergency Contact Information Primary Emergency Contact: Payeur,Nancy Address: [address]          [place] [address] [place] Phone: [phone] Relation: Spouse  Code Status: DNR  Allergies: Primaxin [imipenem]  Chief Complaint  Patient presents with  . Medical Management of Chronic Issues    Routine Visit    HPI: Patient is 70 y.o. male with eaton lambert syndrome and RA who is being seen for routine issues of DM2, HLD and BPH.  Past Medical History:  Diagnosis Date  . BPH (benign prostatic hyperplasia)   . Coronary artery disease   . Diabetes mellitus without complication ([place])   . 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 GERD (gastroesophageal reflux disease)   . Hyperlipidemia   . Rheumatoid arthritis ([place])     No past surgical history on file.    Medication List       Accurate as of 12/26/15 11:59 PM. Always use your most recent med list.          acetaminophen 325 MG tablet Commonly known as:  TYLENOL Take 650 mg by mouth every 6 (six) hours as needed for mild pain. 325 mg caplet administer 2 tablets q6h prn for temp greater than 99.5   CALCIUM 500 PO Take 1 tablet by mouth 3 (three) times daily.   celecoxib 200 MG capsule Commonly known as:  CELEBREX Take 200 mg by mouth daily. For rheumatoid arthritits   cetirizine 10 MG tablet Commonly known as:  ZYRTEC Take 10 mg by mouth daily. For allergies   clonazePAM 0.5 MG tablet Commonly known as:  KLONOPIN Take 0.25 mg by mouth at bedtime.   finasteride 5 MG tablet Commonly known as:  PROSCAR Take 5 mg by mouth daily.   hydrocortisone butyrate 0.1 % Crea cream Commonly known as:  LUCOID Apply 1 application topically daily. To bilateral  arms For contact dermatitis   KLOR-CON 10 PO Take 1 tablet by mouth daily. For potassium supplement   LANTUS SOLOSTAR 100 UNIT/ML Solostar Pen Generic drug:  Insulin Glargine Inject 33 Units into the skin every morning. Also inject 33 units sq at bedtime   LOPID PO Take 600 mg by mouth 2 (two) times daily. For heart disease   magnesium oxide 400 MG tablet Commonly known as:  MAG-OX Take 400 mg by mouth daily.   metFORMIN 500 MG tablet Commonly known as:  GLUCOPHAGE Give 2 and 1/2 tablet po twice daily   metoprolol tartrate 25 MG tablet Commonly known as:  LOPRESSOR Take 25 mg by mouth 2 (two) times daily. For HTN   NITROSTAT 0.4 MG SL tablet Generic drug:  nitroGLYCERIN Place 0.4 mg under the tongue every 5 (five) minutes as needed for chest pain.   PERIDEX 0.12 % solution Generic drug:  chlorhexidine Use as directed 15 mLs in the mouth or throat at bedtime.   PLAQUENIL 200 MG tablet Generic drug:  hydroxychloroquine Take 200 mg by mouth 2 (two) times daily. For rheumatoid arthritis   ranitidine 150 MG capsule Commonly known as:  ZANTAC Take 150 mg by mouth 2 (two) times daily. For GERD   tamsulosin 0.4 MG Caps capsule Commonly known as:  FLOMAX Take 0.8 mg by mouth at bedtime.   THERA  Tabs Give 1 tablet by mouth daily. multivitains therapeutic   torsemide 5 MG tablet Commonly known as:  DEMADEX Give 1 and 1/2 tablet 7.5 mg po daily for pleural effusion (torsemide)   VITAMIN D-3 PO 50,000 units caps give 1 capsule po every month on 15th. (cholecalciferol)       No orders of the defined types were placed in this encounter.   Immunization History  Administered Date(s) Administered  . Influenza Whole 02/19/2013  . Influenza-Unspecified 02/26/2014, 02/10/2015  . PPD Test 03/05/2009  . Pneumococcal-Unspecified 03/17/2010    Social History  Substance Use Topics  . Smoking status: Former Smoker    Packs/day: 0.25    Years: 50.00    Types: Cigarettes     Quit date: 03/19/2013  . Smokeless tobacco: Never Used  . 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, he never has a c/o GENERAL:  no fevers, fatigue, appetite changes SKIN: No itching, rash HEENT: No complaint RESPIRATORY: No cough, wheezing, SOB CARDIAC: No chest pain, palpitations, lower extremity edema  GI: No abdominal pain, No N/V/D or constipation, No heartburn or reflux  GU: No dysuria, frequency or urgency, or incontinence  MUSCULOSKELETAL: No unrelieved bone/joint pain NEUROLOGIC: No headache, dizziness  PSYCHIATRIC: No overt anxiety or sadness  Vitals:   12/26/15 0923  BP: 102/67  Pulse: 74  Resp: 18  Temp: 98.3 F (36.8 C)    Physical Exam  GENERAL APPEARANCE: Alert, conversant, No acute distress  SKIN: No diaphoresis rash HEENT: Unremarkable RESPIRATORY: Breathing is even, unlabored. Lung sounds are clear   CARDIOVASCULAR: Heart RRR no murmurs, rubs or gallops. No peripheral edema  GASTROINTESTINAL: Abdomen is soft, non-tender, not distended w/ normal bowel sounds.  GENITOURINARY: Bladder non tender, not distended  MUSCULOSKELETAL: No abnormal joints or musculature NEUROLOGIC: Cranial nerves 2-12 grossly intact; BLE weakness, wears leg splints and ataxia; BUE tremors much improved PSYCHIATRIC: Mood and affect appropriate to situation, no behavioral issues  Patient Active Problem List   Diagnosis Date Noted  . Movement disorder 09/20/2015  . Aspiration pneumonia ([place]) 04/23/2015  . UTI (urinary tract infection) 10/26/2014  . PVD (peripheral vascular disease) ([place]) 10/26/2014  . OA (osteoarthritis) 07/20/2014  . Duodenal ulcer 07/20/2014  . CHF (congestive heart failure) ([place]) 07/20/2014  . Pleural effusion 07/20/2014  . AR (allergic rhinitis) 07/20/2014  . Insulin use (long-term) in type 2 diabetes ([place]) 07/20/2014  . Dysphagia, oropharyngeal 06/22/2014  . Anemia of chronic disease  02/15/2014  . Hypertensive heart disease with CHF (congestive heart failure) ([place]) 02/15/2014  . PNA (pneumonia) 02/15/2014  . Chronic diarrhea 02/15/2014  . DM (diabetes mellitus), type 2 with peripheral vascular complications ([place]) 62/70/3500  . GERD (gastroesophageal reflux disease)   . Rheumatoid arthritis ([place])   . Hyperlipidemia   . BPH (benign prostatic hyperplasia)   . 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 Pulmonary infiltrates 12/22/2012  . COPD  undetermined GOLD status 12/22/2012  . Smoker 12/22/2012    CBC    Component Value Date/Time   WBC 3.3 06/24/2015   WBC 8.3 03/21/2013 0932   RBC 4.73 03/21/2013 0932   HGB 13.2 (A) 06/24/2015   HCT 41 06/24/2015   PLT 345 06/24/2015   MCV 81.6 03/21/2013 0932    CMP     Component Value Date/Time   NA 140 07/11/2015   K 4.6 07/11/2015   BUN 21 07/11/2015   CREATININE 0.7  07/11/2015   AST 15 06/24/2015   ALT 8 (A) 06/24/2015   ALKPHOS 102 06/24/2015    Assessment and Plan  DM (diabetes mellitus), type 2 with peripheral vascular complications Pt is on glucophage 1250 BID and lantus 33 u q am;last A1c in 11/2015 was 7.4; will look into inc lantus or mealtime insulin to get a1c< 7  Hyperlipidemia Pt is on Lopid 600 mg BID with latest LDL 59 and HDL 28. Plan to cont lopid at curent dose  BPH (benign prostatic hyperplasia) Pt has had no reported UTI's; plan to continue flomax 0.4 mg daily and proscar 5 mg daily   Miriah Maruyama D. Sheppard Coil, MD

## 2016-01-05 ENCOUNTER — Non-Acute Institutional Stay (SKILLED_NURSING_FACILITY): Payer: Medicare Other | Admitting: Internal Medicine

## 2016-01-05 ENCOUNTER — Encounter: Payer: Self-pay | Admitting: Internal Medicine

## 2016-01-05 DIAGNOSIS — L03031 Cellulitis of right toe: Secondary | ICD-10-CM | POA: Diagnosis not present

## 2016-01-05 NOTE — Progress Notes (Signed)
MRN: [id number] Name: Justin Clements  Sex: male Age: 70 y.o. DOB: 10-09-1945  [place] #:  Facility/Room:[facility] Level Of Care: SNF Provider: [name] D Emergency Contacts: Extended Emergency Contact Information Primary Emergency Contact: Amick,Nancy Address: [address]          [place] [address] [place] Phone: [phone] Relation: Spouse  Code Status:   Allergies: Primaxin [imipenem]  Chief Complaint  Patient presents with  . Acute Visit    HPI: Patient is 70 y.o. male who the wound nurse asked me to see because the toes on his R foot are red and tender onset yesterday. Pt admitted the covers from the bed hurt them last night. No fever or other systemic sx.   Past Medical History:  Diagnosis Date  . BPH (benign prostatic hyperplasia)   . Coronary artery disease   . Diabetes mellitus without complication ([place])   . 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 GERD (gastroesophageal reflux disease)   . Hyperlipidemia   . Rheumatoid arthritis ([place])     No past surgical history on file.    Medication List       Accurate as of 01/05/16  8:21 PM. Always use your most recent med list.          acetaminophen 325 MG tablet Commonly known as:  TYLENOL Take 650 mg by mouth every 6 (six) hours as needed for mild pain. 325 mg caplet administer 2 tablets q6h prn for temp greater than 99.5   CALCIUM 500 PO Take 1 tablet by mouth 3 (three) times daily.   celecoxib 200 MG capsule Commonly known as:  CELEBREX Take 200 mg by mouth daily. For rheumatoid arthritits   cetirizine 10 MG tablet Commonly known as:  ZYRTEC Take 10 mg by mouth daily. For allergies   clonazePAM 0.5 MG tablet Commonly known as:  KLONOPIN Take 0.25 mg by mouth at bedtime.   finasteride 5 MG tablet Commonly known as:  PROSCAR Take 5 mg by mouth daily.   hydrocortisone butyrate 0.1 % Crea cream Commonly known as:  LUCOID Apply 1  application topically daily. To bilateral arms For contact dermatitis   KLOR-CON 10 PO Take 1 tablet by mouth daily. For potassium supplement   LANTUS SOLOSTAR 100 UNIT/ML Solostar Pen Generic drug:  Insulin Glargine Inject 33 Units into the skin every morning. Also inject 33 units sq at bedtime   LOPID PO Take 600 mg by mouth 2 (two) times daily. For heart disease   magnesium oxide 400 MG tablet Commonly known as:  MAG-OX Take 400 mg by mouth daily.   metFORMIN 500 MG tablet Commonly known as:  GLUCOPHAGE Give 2 and 1/2 tablet po twice daily   metoprolol tartrate 25 MG tablet Commonly known as:  LOPRESSOR Take 25 mg by mouth 2 (two) times daily. For HTN   NITROSTAT 0.4 MG SL tablet Generic drug:  nitroGLYCERIN Place 0.4 mg under the tongue every 5 (five) minutes as needed for chest pain.   PERIDEX 0.12 % solution Generic drug:  chlorhexidine Use as directed 15 mLs in the mouth or throat at bedtime.   PLAQUENIL 200 MG tablet Generic drug:  hydroxychloroquine Take 200 mg by mouth 2 (two) times daily. For rheumatoid arthritis   ranitidine 150 MG capsule Commonly known as:  ZANTAC Take 150 mg by mouth 2 (two) times daily. For GERD   tamsulosin 0.4 MG Caps capsule Commonly known as:  FLOMAX Take  0.8 mg by mouth at bedtime.   THERA Tabs Give 1 tablet by mouth daily. multivitains therapeutic   torsemide 5 MG tablet Commonly known as:  DEMADEX Give 1 and 1/2 tablet 7.5 mg po daily for pleural effusion (torsemide)   VITAMIN D-3 PO 50,000 units caps give 1 capsule po every month on 15th. (cholecalciferol)       No orders of the defined types were placed in this encounter.   Immunization History  Administered Date(s) Administered  . Influenza Whole 02/19/2013  . Influenza-Unspecified 02/26/2014, 02/10/2015  . PPD Test 03/05/2009  . Pneumococcal-Unspecified 03/17/2010    Social History  Substance Use Topics  . Smoking status: Former Smoker    Packs/day:  0.25    Years: 50.00    Types: Cigarettes    Quit date: 03/19/2013  . Smokeless tobacco: Never Used  . Alcohol use No    Review of Systems  DATA OBTAINED: from patient, nurse, wife GENERAL:  no fevers, fatigue, appetite changes SKIN: pain and redness as per HPI HEENT: No complaint RESPIRATORY: No cough, wheezing, SOB CARDIAC: No chest pain, palpitations, lower extremity edema  GI: No abdominal pain, No N/V/D or constipation, No heartburn or reflux  GU: No dysuria, frequency or urgency, or incontinence  MUSCULOSKELETAL: No unrelieved bone/joint pain NEUROLOGIC: No headache, dizziness  PSYCHIATRIC: No overt anxiety or sadness  Vitals:   01/05/16 1714  BP: 123/75  Pulse: 71  Resp: 18  Temp: 97.4 F (36.3 C)    Physical Exam  GENERAL APPEARANCE: Alert, conversant, No acute distress  SKIN: R toes 2,3,4 have redness, mild swelling, no heat and very TTP there is skin maceration between toe 2 and 3; there is a swath of red medial great toe into distal metatarsal HEENT: Unremarkable RESPIRATORY: Breathing is even, unlabored. Lung sounds are clear   CARDIOVASCULAR: Heart RRR no murmurs, rubs or gallops. No peripheral edema  GASTROINTESTINAL: Abdomen is soft, non-tender, not distended w/ normal bowel sounds.  GENITOURINARY: Bladder non tender, not distended  MUSCULOSKELETAL: pt stiffness and some contractures all extremities NEUROLOGIC: Cranial nerves 2-12 grossly intact. Moves all extremities PSYCHIATRIC: Mood and affect appropriate to situation, no behavioral issues  Patient Active Problem List   Diagnosis Date Noted  . Movement disorder 09/20/2015  . Aspiration pneumonia ([place]) 04/23/2015  . UTI (urinary tract infection) 10/26/2014  . PVD (peripheral vascular disease) ([place]) 10/26/2014  . OA (osteoarthritis) 07/20/2014  . Duodenal ulcer 07/20/2014  . CHF (congestive heart failure) ([place]) 07/20/2014  . Pleural effusion 07/20/2014  . AR (allergic rhinitis) 07/20/2014  .  Insulin use (long-term) in type 2 diabetes ([place]) 07/20/2014  . Dysphagia, oropharyngeal 06/22/2014  . Anemia of chronic disease 02/15/2014  . Hypertensive heart disease with CHF (congestive heart failure) ([place]) 02/15/2014  . PNA (pneumonia) 02/15/2014  . Chronic diarrhea 02/15/2014  . DM (diabetes mellitus), type 2 with peripheral vascular complications ([place]) 08/67/6195  . GERD (gastroesophageal reflux disease)   . Rheumatoid arthritis ([place])   . Hyperlipidemia   . BPH (benign prostatic hyperplasia)   . 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 Pulmonary infiltrates 12/22/2012  . COPD  undetermined GOLD status 12/22/2012  . Smoker 12/22/2012    CBC    Component Value Date/Time   WBC 3.3 06/24/2015   WBC 8.3 03/21/2013 0932   RBC 4.73 03/21/2013 0932   HGB 13.2 (A) 06/24/2015   HCT 41 06/24/2015   PLT 345 06/24/2015   MCV 81.6 03/21/2013 0932    CMP  Component Value Date/Time   NA 140 07/11/2015   K 4.6 07/11/2015   BUN 21 07/11/2015   CREATININE 0.7 07/11/2015   AST 15 06/24/2015   ALT 8 (A) 06/24/2015   ALKPHOS 102 06/24/2015    Assessment and Plan  CELLULITIS TOES- start doxycycline 100 mg BID for 7 days; there may be some small component of athletes foot so have ordered diflucan 150 mg now and 150 mg in 3 days   Time spent > 25 min;> 50% of time with patient was spent reviewing records, labs, tests and studies, counseling and developing plan of care  Hennie Duos, MD

## 2016-01-11 ENCOUNTER — Encounter: Payer: Self-pay | Admitting: Internal Medicine

## 2016-01-11 NOTE — Assessment & Plan Note (Signed)
Pt is on glucophage 1250 BID and lantus 33 u q am;last A1c in 11/2015 was 7.4; will look into inc lantus or mealtime insulin to get a1c< 7

## 2016-01-11 NOTE — Assessment & Plan Note (Signed)
Pt is on Lopid 600 mg BID with latest LDL 59 and HDL 28. Plan to cont lopid at curent dose

## 2016-01-11 NOTE — Assessment & Plan Note (Signed)
Pt has had no reported UTI's; plan to continue flomax 0.4 mg daily and proscar 5 mg daily

## 2016-01-13 ENCOUNTER — Encounter: Payer: Self-pay | Admitting: Internal Medicine

## 2016-01-13 ENCOUNTER — Non-Acute Institutional Stay (SKILLED_NURSING_FACILITY): Payer: Medicare Other | Admitting: Internal Medicine

## 2016-01-13 DIAGNOSIS — L03115 Cellulitis of right lower limb: Secondary | ICD-10-CM | POA: Diagnosis not present

## 2016-01-13 DIAGNOSIS — B353 Tinea pedis: Secondary | ICD-10-CM | POA: Diagnosis not present

## 2016-01-13 NOTE — Progress Notes (Signed)
MRN: [id number] Name: Justin Clements  Sex: male Age: 70 y.o. DOB: Mar 31, 1946  [place] #:  Facility/Room: [facility] / [address] Level Of Care: SNF Provider: Noah Delaine. Sheppard Coil, MD Emergency Contacts: Extended Emergency Contact Information Primary Emergency Contact: Garis,Nancy Address: [address]          [place] [address] [place] Phone: [phone] Relation: Spouse  Code Status: DNR  Allergies: Primaxin [imipenem]  Chief Complaint  Patient presents with  . 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 She noted that several days after the doxycyline was started ( 8/21)  they looked better, now worse. Pt's last day of Doxy is tomorrow. Pt c/p pain toes 2-4, esp toe 4. Pt has hd no fever or other systemic sx.  Past Medical History:  Diagnosis Date  . BPH (benign prostatic hyperplasia)   . Coronary artery disease   . Diabetes mellitus without complication ([place])   . 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 GERD (gastroesophageal reflux disease)   . Hyperlipidemia   . Rheumatoid arthritis ([place])     No past surgical history on file.    Medication List       Accurate as of 01/13/16  3:27 PM. Always use your most recent med list.          acetaminophen 325 MG tablet Commonly known as:  TYLENOL Take 650 mg by mouth every 6 (six) hours as needed for mild pain. 325 mg caplet administer 2 tablets q6h prn for temp greater than 99.5   CALCIUM 500 PO Take 1 tablet by mouth 3 (three) times daily.   celecoxib 200 MG capsule Commonly known as:  CELEBREX Take 200 mg by mouth daily. For rheumatoid arthritits   cetirizine 10 MG tablet Commonly known as:  ZYRTEC Take 10 mg by mouth daily. For allergies   clonazePAM 0.5 MG tablet Commonly known as:  KLONOPIN Take 0.25 mg by mouth at bedtime.    finasteride 5 MG tablet Commonly known as:  PROSCAR Take 5 mg by mouth daily.   hydrocortisone butyrate 0.1 % Crea cream Commonly known as:  LUCOID Apply 1 application topically daily. To bilateral arms For contact dermatitis   KLOR-CON 10 PO Take 1 tablet by mouth daily. For potassium supplement   LANTUS SOLOSTAR 100 UNIT/ML Solostar Pen Generic drug:  Insulin Glargine Inject 33 Units into the skin every morning. Also inject 33 units sq at bedtime   LOPID PO Take 600 mg by mouth 2 (two) times daily. For heart disease   magnesium oxide 400 MG tablet Commonly known as:  MAG-OX Take 400 mg by mouth daily.   metFORMIN 500 MG tablet Commonly known as:  GLUCOPHAGE Give 2 and 1/2 tablet po twice daily   metoprolol tartrate 25 MG tablet Commonly known as:  LOPRESSOR Take 25 mg by mouth 2 (two) times daily. For HTN   NITROSTAT 0.4 MG SL tablet Generic drug:  nitroGLYCERIN Place 0.4 mg under the tongue every 5 (five) minutes as needed for chest pain.   PERIDEX 0.12 % solution Generic drug:  chlorhexidine Use as directed 15 mLs in the mouth or throat at bedtime.   PLAQUENIL 200 MG tablet Generic drug:  hydroxychloroquine Take 200 mg by mouth 2 (two) times daily. For rheumatoid  arthritis   ranitidine 150 MG capsule Commonly known as:  ZANTAC Take 150 mg by mouth 2 (two) times daily. For GERD   tamsulosin 0.4 MG Caps capsule Commonly known as:  FLOMAX Take 0.8 mg by mouth at bedtime.   THERA Tabs Give 1 tablet by mouth daily. multivitains therapeutic   torsemide 5 MG tablet Commonly known as:  DEMADEX Give 1 and 1/2 tablet 7.5 mg po daily for pleural effusion (torsemide)   VITAMIN D-3 PO 50,000 units caps give 1 capsule po every month on 15th. (cholecalciferol)       No orders of the defined types were placed in this encounter.   Immunization History  Administered Date(s) Administered  . Influenza Whole 02/19/2013  . Influenza-Unspecified 02/26/2014,  02/10/2015  . PPD Test 03/05/2009  . Pneumococcal-Unspecified 03/17/2010    Social History  Substance Use Topics  . Smoking status: Former Smoker    Packs/day: 0.25    Years: 50.00    Types: Cigarettes    Quit date: 03/19/2013  . Smokeless tobacco: Never Used  . Alcohol use No    Review of Systems  DATA OBTAINED: from patient, wife GENERAL:  no fevers, fatigue, appetite changes SKIN: pain and swelling of toes 2-4 as per HPI HEENT: No complaint RESPIRATORY: No cough, wheezing, SOB CARDIAC: No chest pain, palpitations, lower extremity edema  GI: No abdominal pain, No N/V/D or constipation, No heartburn or reflux  GU: No dysuria, frequency or urgency, or incontinence  MUSCULOSKELETAL: No unrelieved bone/joint pain NEUROLOGIC: No headache, dizziness  PSYCHIATRIC: No overt anxiety or sadness  Vitals:   01/13/16 1524  BP: 124/74  Pulse: 71  Resp: 18  Temp: 98.2 F (36.8 C)    Physical Exam  GENERAL APPEARANCE: Alert, conversant, No acute distress  SKIN:  increased redness and now swelling of toes as well. Lesion on side of great toe is healed, there may be several layers of skin gone in some areas of between 3rd and 4th toe, moistness between toes HEENT: Unremarkable RESPIRATORY: Breathing is even, unlabored. Lung sounds are clear   CARDIOVASCULAR: Heart RRR no murmurs, rubs or gallops. No peripheral edema  GASTROINTESTINAL: Abdomen is soft, non-tender, not distended w/ normal bowel sounds.  GENITOURINARY: Bladder non tender, not distended  MUSCULOSKELETAL: stiffness of LE, ataxia od UE NEUROLOGIC: Cranial nerves 2-12 grossly intact. Moves all extremities PSYCHIATRIC: Mood and affect appropriate to situation, no behavioral issues  Patient Active Problem List   Diagnosis Date Noted  . Movement disorder 09/20/2015  . Aspiration pneumonia ([place]) 04/23/2015  . UTI (urinary tract infection) 10/26/2014  . PVD (peripheral vascular disease) ([place]) 10/26/2014  . OA  (osteoarthritis) 07/20/2014  . Duodenal ulcer 07/20/2014  . CHF (congestive heart failure) ([place]) 07/20/2014  . Pleural effusion 07/20/2014  . AR (allergic rhinitis) 07/20/2014  . Insulin use (long-term) in type 2 diabetes ([place]) 07/20/2014  . Dysphagia, oropharyngeal 06/22/2014  . Anemia of chronic disease 02/15/2014  . Hypertensive heart disease with CHF (congestive heart failure) ([place]) 02/15/2014  . PNA (pneumonia) 02/15/2014  . Chronic diarrhea 02/15/2014  . DM (diabetes mellitus), type 2 with peripheral vascular complications ([place]) 67/61/9509  . GERD (gastroesophageal reflux disease)   . Rheumatoid arthritis ([place])   . Hyperlipidemia   . BPH (benign prostatic hyperplasia)   . 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 Pulmonary infiltrates 12/22/2012  . COPD  undetermined GOLD status 12/22/2012  . Smoker 12/22/2012    CBC    Component Value Date/Time   WBC 3.3  06/24/2015   WBC 8.3 03/21/2013 0932   RBC 4.73 03/21/2013 0932   HGB 13.2 (A) 06/24/2015   HCT 41 06/24/2015   PLT 345 06/24/2015   MCV 81.6 03/21/2013 0932    CMP     Component Value Date/Time   NA 140 07/11/2015   K 4.6 07/11/2015   BUN 21 07/11/2015   CREATININE 0.7 07/11/2015   AST 15 06/24/2015   ALT 8 (A) 06/24/2015   ALKPHOS 102 06/24/2015    Assessment and Plan  CELLULITIS FOOT/TINEA PEDIS- not responding to doxycycline; d/c doxy, start clinda 300 mg q 6 for 10 days; diflucan 100 mg daily for 10 days for any component of tinea pedis, change to new socks in the middle of the day, no more lotion of feet; wound nurse , who was with [place] during the exam, will follow daily   Time spent > 35 min;> 50% of time with patient was spent reviewing records, labs, tests and studies, counseling and developing plan of care  Webb Silversmith D. Sheppard Coil, MD

## 2016-01-16 LAB — BASIC METABOLIC PANEL
BUN: 20 mg/dL (ref 4–21)
Creatinine: 1 mg/dL (ref 0.6–1.3)
Glucose: 187 mg/dL
Potassium: 4.7 mmol/L (ref 3.4–5.3)
Sodium: 138 mmol/L (ref 137–147)

## 2016-01-16 LAB — CBC AND DIFFERENTIAL
HEMATOCRIT: 39 % — AB (ref 41–53)
HEMOGLOBIN: 13.1 g/dL — AB (ref 13.5–17.5)
WBC: 3.7 10^3/mL

## 2016-01-29 ENCOUNTER — Non-Acute Institutional Stay (SKILLED_NURSING_FACILITY): Payer: Medicare Other | Admitting: Internal Medicine

## 2016-01-29 ENCOUNTER — Encounter: Payer: Self-pay | Admitting: Internal Medicine

## 2016-01-29 DIAGNOSIS — G119 Hereditary ataxia, unspecified: Secondary | ICD-10-CM

## 2016-01-29 DIAGNOSIS — G7080 Lambert-Eaton syndrome, unspecified: Secondary | ICD-10-CM

## 2016-01-29 DIAGNOSIS — K219 Gastro-esophageal reflux disease without esophagitis: Secondary | ICD-10-CM | POA: Diagnosis not present

## 2016-01-29 NOTE — Progress Notes (Signed)
MRN: [id number] Name: Justin Clements  Sex: male Age: 70 y.o. DOB: 27-May-1945  [place] #:  Facility/Room: [facility] / [address] Level Of Care: SNF Provider: Noah Delaine. Sheppard Coil, MD Emergency Contacts: Extended Emergency Contact Information Primary Emergency Contact: Calamia,Nancy Address: [address]          [place] [address] [place] Phone: [phone] Relation: Spouse  Code Status: DNR  Allergies: Primaxin [imipenem]  Chief Complaint  Patient presents with  . Medical Management of Chronic Issues    Routine Visit    HPI: Patient is 70 y.o. male who who is being seen for routine issues of GERD, [name] Syndrome and cerebellar ataxia.   Past Medical History:  Diagnosis Date  . BPH (benign prostatic hyperplasia)   . Coronary artery disease   . Diabetes mellitus without complication ([place])   . 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 GERD (gastroesophageal reflux disease)   . Hyperlipidemia   . Rheumatoid arthritis ([place])     No past surgical history on file.    Medication List       Accurate as of 01/29/16 11:59 PM. Always use your most recent med list.          acetaminophen 325 MG tablet Commonly known as:  TYLENOL Take 650 mg by mouth every 6 (six) hours as needed for mild pain. 325 mg caplet administer 2 tablets q6h prn for temp greater than 99.5   CALCIUM 500 PO Take 1 tablet by mouth 3 (three) times daily.   celecoxib 200 MG capsule Commonly known as:  CELEBREX Take 200 mg by mouth daily. For rheumatoid arthritits   cetirizine 10 MG tablet Commonly known as:  ZYRTEC Take 10 mg by mouth daily. For allergies   clonazePAM 0.5 MG tablet Commonly known as:  KLONOPIN Take 0.25 mg by mouth at bedtime.   finasteride 5 MG tablet Commonly known as:  PROSCAR Take 5 mg by mouth daily.   hydrocortisone butyrate 0.1 % Crea cream Commonly known as:  LUCOID Apply 1 application topically daily. To  bilateral arms For contact dermatitis   KLOR-CON 10 PO Take 1 tablet by mouth daily. For potassium supplement   LANTUS SOLOSTAR 100 UNIT/ML Solostar Pen Generic drug:  Insulin Glargine Inject 33 Units into the skin every morning. Also inject 33 units sq at bedtime   LOPID PO Take 600 mg by mouth 2 (two) times daily. For heart disease   magnesium oxide 400 MG tablet Commonly known as:  MAG-OX Take 400 mg by mouth daily.   metFORMIN 500 MG tablet Commonly known as:  GLUCOPHAGE Give 2 and 1/2 tablets (1250 mg)  po twice daily   metoprolol tartrate 25 MG tablet Commonly known as:  LOPRESSOR Take 25 mg by mouth 2 (two) times daily. For HTN   NITROSTAT 0.4 MG SL tablet Generic drug:  nitroGLYCERIN Place 0.4 mg under the tongue every 5 (five) minutes as needed for chest pain.   PERIDEX 0.12 % solution Generic drug:  chlorhexidine Use as directed 15 mLs in the mouth or throat at bedtime.   PLAQUENIL 200 MG tablet Generic drug:  hydroxychloroquine Take 200 mg by mouth 2 (two) times daily. For rheumatoid arthritis   ranitidine 75 MG tablet Commonly known as:  ZANTAC Take 150 mg by mouth 2 (two) times daily.   tamsulosin 0.4 MG Caps capsule Commonly known as:  FLOMAX Take 0.8 mg by mouth at bedtime.   THERA  Tabs Give 1 tablet by mouth daily. multivitains therapeutic   torsemide 5 MG tablet Commonly known as:  DEMADEX Give 1 and 1/2 tablet 7.5 mg po daily for pleural effusion (torsemide)   VITAMIN D-3 PO 50,000 units caps give 1 capsule po every month on 15th. (cholecalciferol)       No orders of the defined types were placed in this encounter.   Immunization History  Administered Date(s) Administered  . Influenza Whole 02/19/2013  . Influenza-Unspecified 02/26/2014, 02/10/2015  . PPD Test 03/05/2009  . Pneumococcal-Unspecified 03/17/2010    Social History  Substance Use Topics  . Smoking status: Former Smoker    Packs/day: 0.25    Years: 50.00    Types:  Cigarettes    Quit date: 03/19/2013  . Smokeless tobacco: Never Used  . Alcohol use No    Review of Systems  DATA OBTAINED: from patient GENERAL:  no fevers, fatigue, appetite changes SKIN: No itching, rash HEENT: No complaint RESPIRATORY: No cough, wheezing, SOB CARDIAC: No chest pain, palpitations, lower extremity edema  GI: No abdominal pain, No N/V/D or constipation, No heartburn or reflux  GU: No dysuria, frequency or urgency, or incontinence  MUSCULOSKELETAL: No unrelieved bone/joint pain NEUROLOGIC: No headache, dizziness  PSYCHIATRIC: No overt anxiety or sadness  Vitals:   01/29/16 0920  BP: 130/71  Pulse: 76  Resp: 20  Temp: 97.5 F (36.4 C)    Physical Exam  GENERAL APPEARANCE: Alert, conversant, No acute distress  SKIN: No diaphoresis rash HEENT: Unremarkable RESPIRATORY: Breathing is even, unlabored. Lung sounds are clear   CARDIOVASCULAR: Heart RRR no murmurs, rubs or gallops. No peripheral edema  GASTROINTESTINAL: Abdomen is soft, non-tender, not distended w/ normal bowel sounds.  GENITOURINARY: Bladder non tender, not distended  MUSCULOSKELETAL: some stiffness and decreased mobility NEUROLOGIC: Cranial nerves 2-12 grossly intact; UE ataxia PSYCHIATRIC: Mood and affect appropriate to situation, no behavioral issues  Patient Active Problem List   Diagnosis Date Noted  . 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 Movement disorder 09/20/2015  . Aspiration pneumonia ([place]) 04/23/2015  . UTI (urinary tract infection) 10/26/2014  . PVD (peripheral vascular disease) ([place]) 10/26/2014  . OA (osteoarthritis) 07/20/2014  . Duodenal ulcer 07/20/2014  . CHF (congestive heart failure) ([place]) 07/20/2014  . Pleural effusion 07/20/2014  . AR (allergic rhinitis) 07/20/2014  . Insulin use (long-term) in type 2 diabetes ([place]) 07/20/2014  . Dysphagia, oropharyngeal 06/22/2014  . Anemia of chronic disease 02/15/2014  . Hypertensive heart disease with CHF (congestive heart  failure) ([place]) 02/15/2014  . PNA (pneumonia) 02/15/2014  . Chronic diarrhea 02/15/2014  . DM (diabetes mellitus), type 2 with peripheral vascular complications ([place]) 15/17/6160  . GERD (gastroesophageal reflux disease)   . Rheumatoid arthritis ([place])   . Hyperlipidemia   . BPH (benign prostatic hyperplasia)   . 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 Pulmonary infiltrates 12/22/2012  . COPD  undetermined GOLD status 12/22/2012  . Smoker 12/22/2012    CBC    Component Value Date/Time   WBC 3.7 01/16/2016   WBC 8.3 03/21/2013 0932   RBC 4.73 03/21/2013 0932   HGB 13.1 (A) 01/16/2016   HCT 39 (A) 01/16/2016   PLT 345 06/24/2015   MCV 81.6 03/21/2013 0932    CMP     Component Value Date/Time   NA 138 01/16/2016   K 4.7 01/16/2016   BUN 20 01/16/2016   CREATININE 1.0 01/16/2016   AST 15 06/24/2015   ALT 8 (A) 06/24/2015   ALKPHOS 102 06/24/2015  Assessment and Plan  GERD (gastroesophageal reflux disease) No reports of reflux or pain; plan to cont zantac 150 mg BID; will cont to monitor  Eaton-Lambert syndrome Progressive, pt admits to increased weakness and worsening of BUE ataxia;  Nothing further that neurology can offer. Pt continues to work with restorative  Cerebellar ataxia ([place]) Progressing along with Beulah Gandy Lambert;2/2 to [employer]; increased weakness and inc BUE araxia; UE ataxia improved with clonazepam 0,25 mg qHS; pt doesn't want to be sleepy during the day and won't take more   Anne D. Sheppard Coil, MD

## 2016-02-01 ENCOUNTER — Encounter: Payer: Self-pay | Admitting: Internal Medicine

## 2016-02-01 DIAGNOSIS — G119 Hereditary ataxia, unspecified: Secondary | ICD-10-CM

## 2016-02-01 HISTORY — DX: Hereditary ataxia, unspecified: G11.9

## 2016-02-01 NOTE — Assessment & Plan Note (Signed)
No reports of reflux or pain; plan to cont zantac 150 mg BID; will cont to monitor

## 2016-02-01 NOTE — Assessment & Plan Note (Signed)
Progressive, pt admits to increased weakness and worsening of BUE ataxia;  Nothing further that neurology can offer. Pt continues to work with restorative

## 2016-02-01 NOTE — Assessment & Plan Note (Signed)
Progressing along with Justin Clements;2/2 to [name]; increased weakness and inc BUE araxia; UE ataxia improved with clonazepam 0,25 mg qHS; pt doesn't want to be sleepy during the day and won't take more

## 2016-02-04 LAB — MICROALBUMIN, URINE: MICROALB UR: 1

## 2016-02-10 ENCOUNTER — Encounter: Payer: Self-pay | Admitting: Internal Medicine

## 2016-02-10 ENCOUNTER — Non-Acute Institutional Stay (SKILLED_NURSING_FACILITY): Payer: Medicare Other | Admitting: Internal Medicine

## 2016-02-10 DIAGNOSIS — D899 Disorder involving the immune mechanism, unspecified: Secondary | ICD-10-CM | POA: Diagnosis not present

## 2016-02-10 DIAGNOSIS — D849 Immunodeficiency, unspecified: Secondary | ICD-10-CM

## 2016-02-10 DIAGNOSIS — R059 Cough, unspecified: Secondary | ICD-10-CM

## 2016-02-10 DIAGNOSIS — R05 Cough: Secondary | ICD-10-CM | POA: Diagnosis not present

## 2016-02-10 NOTE — Progress Notes (Signed)
Location:  [facility] Room Number: [facility] of Service:  SNF (23)  Aryah Doering. Sheral Apley, MD  Patient Care Team: Hennie Duos, MD as PCP - General (Internal Medicine)  Extended Emergency Contact Information Primary Emergency Contact: Osvaldo Shipper Address: [place] [address] [place] Phone: [phone] Relation: Spouse    Allergies: Primaxin [imipenem]  Chief Complaint  Patient presents with  . Acute Visit    Acute    HPI: Patient is 70 y.o. male who is being seen for cough for several days.Pt admits to nasal and chest congestion, no fever or SOB or weakness. Cough is not productive, not made better or worse by anything.  Past Medical History:  Diagnosis Date  . BPH (benign prostatic hyperplasia)   . Coronary artery disease   . Diabetes mellitus without complication ([place])   . 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 GERD (gastroesophageal reflux disease)   . Hyperlipidemia   . Rheumatoid arthritis ([place])     No past surgical history on file.    Medication List       Accurate as of 02/10/16  2:29 PM. Always use your most recent med list.          acetaminophen 325 MG tablet Commonly known as:  TYLENOL Take 650 mg by mouth every 6 (six) hours as needed for mild pain. 325 mg caplet administer 2 tablets q6h prn for temp greater than 99.5   CALCIUM 500 PO Take 1 tablet by mouth 3 (three) times daily.   celecoxib 200 MG capsule Commonly known as:  CELEBREX Take 200 mg by mouth daily. For rheumatoid arthritits   cetirizine 10 MG tablet Commonly known as:  ZYRTEC Take 10 mg by mouth daily. For allergies   clonazePAM 0.5 MG tablet Commonly known as:  KLONOPIN Take 0.25 mg by mouth at bedtime.   finasteride 5 MG tablet Commonly known as:  PROSCAR Take 5 mg by mouth daily.   hydrocortisone butyrate 0.1 % Crea cream Commonly known as:   LUCOID Apply 1 application topically daily. To bilateral arms For contact dermatitis   KLOR-CON 10 PO Take 1 tablet by mouth daily. For potassium supplement   LANTUS SOLOSTAR 100 UNIT/ML Solostar Pen Generic drug:  Insulin Glargine Inject 33 Units into the skin every morning. Also inject 33 units sq at bedtime   LOPID PO Take 600 mg by mouth 2 (two) times daily. For heart disease   magnesium oxide 400 MG tablet Commonly known as:  MAG-OX Take 400 mg by mouth daily.   metFORMIN 500 MG tablet Commonly known as:  GLUCOPHAGE Give 2 and 1/2 tablets (1250 mg)  po twice daily   metoprolol tartrate 25 MG tablet Commonly known as:  LOPRESSOR Take 25 mg by mouth 2 (two) times daily. For HTN   NITROSTAT 0.4 MG SL tablet Generic drug:  nitroGLYCERIN Place 0.4 mg under the tongue every 5 (five) minutes as needed for chest pain.   PERIDEX 0.12 % solution Generic drug:  chlorhexidine Use as directed 15 mLs in the mouth or throat at bedtime.   PLAQUENIL 200 MG tablet Generic drug:  hydroxychloroquine Take 200 mg by mouth 2 (two) times daily. For rheumatoid arthritis   ranitidine 75 MG tablet Commonly known as:  ZANTAC Take 150 mg by mouth 2 (two) times daily.   tamsulosin 0.4 MG Caps capsule Commonly  known as:  FLOMAX Take 0.8 mg by mouth at bedtime.   THERA Tabs Give 1 tablet by mouth daily. multivitains therapeutic   torsemide 5 MG tablet Commonly known as:  DEMADEX Give 1 and 1/2 tablet 7.5 mg po daily for pleural effusion (torsemide)   VITAMIN D-3 PO 50,000 units caps give 1 capsule po every month on 15th. (cholecalciferol)       No orders of the defined types were placed in this encounter.   Immunization History  Administered Date(s) Administered  . Influenza Whole 02/19/2013  . Influenza-Unspecified 02/26/2014, 02/10/2015  . PPD Test 03/05/2009  . Pneumococcal-Unspecified 03/17/2010    Social History  Substance Use Topics  . Smoking status: Former Smoker     Packs/day: 0.25    Years: 50.00    Types: Cigarettes    Quit date: 03/19/2013  . Smokeless tobacco: Never Used  . Alcohol use No    Review of Systems  DATA OBTAINED: from patient, nurse GENERAL:  no fevers, fatigue, appetite changes SKIN: No itching, rash HEENT: No complaint RESPIRATORY: + cough, no wheezing, no SOB CARDIAC: No chest pain, palpitations, lower extremity edema  GI: No abdominal pain, No N/V/D or constipation, No heartburn or reflux  GU: No dysuria, frequency or urgency, or incontinence  MUSCULOSKELETAL: No unrelieved bone/joint pain NEUROLOGIC: No headache, dizziness  PSYCHIATRIC: No overt anxiety or sadness  Vitals:   02/10/16 1427  BP: 130/71  Pulse: 62  Resp: 18  Temp: 97.5 F (36.4 C)   Body mass index is 32.49 kg/m. Physical Exam  GENERAL APPEARANCE: Alert, conversant, No acute distress  SKIN: No diaphoresis rash HEENT: Unremarkable RESPIRATORY: Breathing is even, unlabored. Lung sounds are no wheezing , good AF, squeak R base; O2 sat RA 94% CARDIOVASCULAR: Heart RRR no murmurs, rubs or gallops. No peripheral edema  GASTROINTESTINAL: Abdomen is soft, non-tender, not distended w/ normal bowel sounds.  GENITOURINARY: Bladder non tender, not distended  MUSCULOSKELETAL: No abnormal joints or musculature NEUROLOGIC: Cranial nerves 2-12 grossly intact. Moves all extremities PSYCHIATRIC: Mood and affect appropriate to situation, no behavioral issues  Patient Active Problem List   Diagnosis Date Noted  . 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 Movement disorder 09/20/2015  . Aspiration pneumonia ([place]) 04/23/2015  . UTI (urinary tract infection) 10/26/2014  . PVD (peripheral vascular disease) ([place]) 10/26/2014  . OA (osteoarthritis) 07/20/2014  . Duodenal ulcer 07/20/2014  . CHF (congestive heart failure) ([place]) 07/20/2014  . Pleural effusion 07/20/2014  . AR (allergic rhinitis) 07/20/2014  . Insulin use (long-term) in type 2 diabetes ([place])  07/20/2014  . Dysphagia, oropharyngeal 06/22/2014  . Anemia of chronic disease 02/15/2014  . Hypertensive heart disease with CHF (congestive heart failure) ([place]) 02/15/2014  . PNA (pneumonia) 02/15/2014  . Chronic diarrhea 02/15/2014  . DM (diabetes mellitus), type 2 with peripheral vascular complications ([place]) 61/95/0932  . GERD (gastroesophageal reflux disease)   . Rheumatoid arthritis ([place])   . Hyperlipidemia   . BPH (benign prostatic hyperplasia)   . 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 Pulmonary infiltrates 12/22/2012  . COPD  undetermined GOLD status 12/22/2012  . Smoker 12/22/2012    CMP     Component Value Date/Time   NA 138 01/16/2016   K 4.7 01/16/2016   BUN 20 01/16/2016   CREATININE 1.0 01/16/2016   AST 15 06/24/2015   ALT 8 (A) 06/24/2015   ALKPHOS 102 06/24/2015    Recent Labs  07/11/15 01/16/16  NA 140 138  K 4.6 4.7  BUN  21 20  CREATININE 0.7 1.0    Recent Labs  06/24/15  AST 15  ALT 8*  ALKPHOS 102    Recent Labs  06/24/15 01/16/16  WBC 3.3 3.7  HGB 13.2* 13.1*  HCT 41 39*  PLT 345  --     Recent Labs  06/24/15 11/28/15  CHOL 124 123  LDLCALC 70 59  TRIG 151 178*   No results found for: [facility] Lab Results  Component Value Date   TSH 4.05 03/24/2015   Lab Results  Component Value Date   HGBA1C 7.4 11/28/2015   Lab Results  Component Value Date   CHOL 123 11/28/2015   HDL 28 (A) 11/28/2015   LDLCALC 59 11/28/2015   TRIG 178 (A) 11/28/2015    Significant Diagnostic Results in last 30 days:  No results found.  Assessment and Plan  COUGH/IMMUNOSUPPRESION - have ordered CXR; have started mucinex 600 mg BID; will monitor   Time spent > 25 min;> 50% of time with patient was spent reviewing records, labs, tests and studies, counseling and developing plan of care  Webb Silversmith D. Sheppard Coil, MD

## 2016-02-11 ENCOUNTER — Non-Acute Institutional Stay (SKILLED_NURSING_FACILITY): Payer: Medicare Other | Admitting: Internal Medicine

## 2016-02-11 ENCOUNTER — Encounter: Payer: Self-pay | Admitting: Internal Medicine

## 2016-02-11 DIAGNOSIS — R918 Other nonspecific abnormal finding of lung field: Secondary | ICD-10-CM | POA: Diagnosis not present

## 2016-02-11 DIAGNOSIS — R05 Cough: Secondary | ICD-10-CM

## 2016-02-11 DIAGNOSIS — R059 Cough, unspecified: Secondary | ICD-10-CM

## 2016-02-11 NOTE — Progress Notes (Signed)
Location:  [facility] Room Number: [place] of Service:  SNF [phone])  [name], MD  Patient Care Team: Hennie Duos, MD as PCP - General (Internal Medicine)  Extended Emergency Contact Information Primary Emergency Contact: Latonya, Knight Address: [address] [address] [place] Phone: [phone] Relation: Spouse    Allergies: Primaxin [imipenem]  Chief Complaint  Patient presents with  . Acute Visit    Acute    HPI: Patient is 70 y.o. male whose CXR from yesterday has come back. There is clearing of the LLL infiltrate prior. Pt had had PNA in April but further investigation showed he had LLL infiltrate in Jan 2017. This requires further investigation. Pt doesn't know anything about it but is willing to have a CT chest.   Past Medical History:  Diagnosis Date  . BPH (benign prostatic hyperplasia)   . Coronary artery disease   . Diabetes mellitus without complication ([place])   . 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 GERD (gastroesophageal reflux disease)   . Hyperlipidemia   . Rheumatoid arthritis ([place])     No past surgical history on file.    Medication List       Accurate as of 02/11/16  2:22 PM. Always use your most recent med list.          acetaminophen 325 MG tablet Commonly known as:  TYLENOL Take 650 mg by mouth every 6 (six) hours as needed for mild pain. 325 mg caplet administer 2 tablets q6h prn for temp greater than 99.5   CALCIUM 500 PO Take 1 tablet by mouth 3 (three) times daily.   celecoxib 200 MG capsule Commonly known as:  CELEBREX Take 200 mg by mouth daily. For rheumatoid arthritits   cetirizine 10 MG tablet Commonly known as:  ZYRTEC Take 10 mg by mouth daily. For allergies   clonazePAM 0.5 MG tablet Commonly known as:  KLONOPIN Take 0.25 mg by mouth at bedtime.   finasteride 5 MG tablet Commonly known as:   PROSCAR Take 5 mg by mouth daily.   hydrocortisone butyrate 0.1 % Crea cream Commonly known as:  LUCOID Apply 1 application topically daily. To bilateral arms For contact dermatitis   KLOR-CON 10 PO Take 1 tablet by mouth daily. For potassium supplement   LANTUS SOLOSTAR 100 UNIT/ML Solostar Pen Generic drug:  Insulin Glargine Inject 33 Units into the skin every morning. Also inject 33 units sq at bedtime   LOPID PO Take 600 mg by mouth 2 (two) times daily. For heart disease   magnesium oxide 400 MG tablet Commonly known as:  MAG-OX Take 400 mg by mouth daily.   metFORMIN 500 MG tablet Commonly known as:  GLUCOPHAGE Give 2 and 1/2 tablets (1250 mg)  po twice daily   metoprolol tartrate 25 MG tablet Commonly known as:  LOPRESSOR Take 25 mg by mouth 2 (two) times daily. For HTN   NITROSTAT 0.4 MG SL tablet Generic drug:  nitroGLYCERIN Place 0.4 mg under the tongue every 5 (five) minutes as needed for chest pain.   PERIDEX 0.12 % solution Generic drug:  chlorhexidine Use as directed 15 mLs in the mouth or throat at bedtime.   PLAQUENIL 200 MG tablet Generic drug:  hydroxychloroquine Take 200 mg by mouth 2 (two) times daily. For rheumatoid arthritis   ranitidine 75 MG tablet Commonly known as:  ZANTAC Take 150 mg by mouth 2 (two) times daily.   tamsulosin 0.4 MG Caps capsule Commonly known as:  FLOMAX Take 0.8 mg by mouth at bedtime.   THERA Tabs Give 1 tablet by mouth daily. multivitains therapeutic   torsemide 5 MG tablet Commonly known as:  DEMADEX Give 1 and 1/2 tablet 7.5 mg po daily for pleural effusion (torsemide)   VITAMIN D-3 PO 50,000 units caps give 1 capsule po every month on 15th. (cholecalciferol)       No orders of the defined types were placed in this encounter.   Immunization History  Administered Date(s) Administered  . Influenza Whole 02/19/2013  . Influenza-Unspecified 02/26/2014, 02/10/2015  . PPD Test 03/05/2009  .  Pneumococcal-Unspecified 03/17/2010    Social History  Substance Use Topics  . Smoking status: Former Smoker    Packs/day: 0.25    Years: 50.00    Types: Cigarettes    Quit date: 03/19/2013  . Smokeless tobacco: Never Used  . Alcohol use No    Review of Systems  DATA OBTAINED: from patient GENERAL:  no fevers, fatigue, appetite changes SKIN: No itching, rash HEENT: No complaint RESPIRATORY: + cough,no  Wheezing,no  SOB CARDIAC: No chest pain, palpitations, lower extremity edema  GI: No abdominal pain, No N/V/D or constipation, No heartburn or reflux  GU: No dysuria, frequency or urgency, or incontinence  MUSCULOSKELETAL: No unrelieved bone/joint pain NEUROLOGIC: No headache, dizziness  PSYCHIATRIC: No overt anxiety or sadness  Vitals:   02/11/16 1420  BP: 130/71  Pulse: 62  Resp: 18  Temp: 97.4 F (36.3 C)   Body mass index is 32.49 kg/m. Physical Exam  GENERAL APPEARANCE: Alert, conversant, No acute distress  SKIN: No diaphoresis rash HEENT: Unremarkable RESPIRATORY: Breathing is even, unlabored. Lung sounds are clear   CARDIOVASCULAR: Heart RRR no murmurs, rubs or gallops. No peripheral edema  GASTROINTESTINAL: Abdomen is soft, non-tender, not distended w/ normal bowel sounds.  GENITOURINARY: Bladder non tender, not distended  MUSCULOSKELETAL: No abnormal joints or musculature NEUROLOGIC: Cranial nerves 2-12 grossly intact. Moves all extremities PSYCHIATRIC: Mood and affect appropriate to situation, no behavioral issues  Patient Active Problem List   Diagnosis Date Noted  . 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 Movement disorder 09/20/2015  . Aspiration pneumonia ([place]) 04/23/2015  . UTI (urinary tract infection) 10/26/2014  . PVD (peripheral vascular disease) ([place]) 10/26/2014  . OA (osteoarthritis) 07/20/2014  . Duodenal ulcer 07/20/2014  . CHF (congestive heart failure) ([place]) 07/20/2014  . Pleural effusion 07/20/2014  . AR (allergic rhinitis) 07/20/2014   . Insulin use (long-term) in type 2 diabetes ([place]) 07/20/2014  . Dysphagia, oropharyngeal 06/22/2014  . Anemia of chronic disease 02/15/2014  . Hypertensive heart disease with CHF (congestive heart failure) ([place]) 02/15/2014  . PNA (pneumonia) 02/15/2014  . Chronic diarrhea 02/15/2014  . DM (diabetes mellitus), type 2 with peripheral vascular complications ([place]) 69/62/9528  . GERD (gastroesophageal reflux disease)   . Rheumatoid arthritis ([place])   . Hyperlipidemia   . BPH (benign prostatic hyperplasia)   . 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 Pulmonary infiltrates 12/22/2012  . COPD  undetermined GOLD status 12/22/2012  . Smoker 12/22/2012    CMP     Component Value Date/Time   NA 138 01/16/2016   K 4.7 01/16/2016   BUN 20 01/16/2016   CREATININE 1.0 01/16/2016   AST 15 06/24/2015   ALT 8 (A) 06/24/2015   ALKPHOS 102 06/24/2015    Recent Labs  07/11/15 01/16/16  NA  140 138  K 4.6 4.7  BUN 21 20  CREATININE 0.7 1.0    Recent Labs  06/24/15  AST 15  ALT 8*  ALKPHOS 102    Recent Labs  06/24/15 01/16/16  WBC 3.3 3.7  HGB 13.2* 13.1*  HCT 41 39*  PLT 345  --     Recent Labs  06/24/15 11/28/15  CHOL 124 123  LDLCALC 70 59  TRIG 151 178*   No results found for: [facility] Lab Results  Component Value Date   TSH 4.05 03/24/2015   Lab Results  Component Value Date   HGBA1C 7.4 11/28/2015   Lab Results  Component Value Date   CHOL 123 11/28/2015   HDL 28 (A) 11/28/2015   LDLCALC 59 11/28/2015   TRIG 178 (A) 11/28/2015    Significant Diagnostic Results in last 30 days:  No results found.  Assessment and Plan  PERSISTENT LLL INFILTRATE - I spoke with pt's wife and she knew all about the infiltrate. It had been looked at , studied, biopsied and was a fungal infection, treated.  COUGH - has not progressed ; will cont to monitor  Time spent > 25 min;> 50% of time with patient was spent reviewing records, labs, tests and studies, counseling and  developing plan of care  Webb Silversmith D. Sheppard Coil, MD

## 2016-02-26 ENCOUNTER — Non-Acute Institutional Stay (SKILLED_NURSING_FACILITY): Payer: Medicare Other | Admitting: Internal Medicine

## 2016-02-26 ENCOUNTER — Encounter: Payer: Self-pay | Admitting: Internal Medicine

## 2016-02-26 DIAGNOSIS — I5032 Chronic diastolic (congestive) heart failure: Secondary | ICD-10-CM

## 2016-02-26 DIAGNOSIS — R1312 Dysphagia, oropharyngeal phase: Secondary | ICD-10-CM

## 2016-02-26 DIAGNOSIS — I110 Hypertensive heart disease with heart failure: Secondary | ICD-10-CM

## 2016-02-26 NOTE — Progress Notes (Signed)
Location:  [facility] Room Number: [place] of Service:  SNF [phone])  [name], MD  Patient Care Team: Hennie Duos, MD as PCP - General (Internal Medicine)  Extended Emergency Contact Information Primary Emergency Contact: Levon, Boettcher Address: [address] [address] [place] Phone: [phone] Relation: Spouse    Allergies: Primaxin [imipenem]  Chief Complaint  Patient presents with  . Medical Management of Chronic Issues    Routine Visit    HPI: Patient is 70 y.o. male who is being seen for routine issues of HTN, CHF and dysphagia.  Past Medical History:  Diagnosis Date  . BPH (benign prostatic hyperplasia)   . Coronary artery disease   . Diabetes mellitus without complication ([place])   . 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 GERD (gastroesophageal reflux disease)   . Hyperlipidemia   . Rheumatoid arthritis ([place])     No past surgical history on file.    Medication List       Accurate as of 02/26/16 11:59 PM. Always use your most recent med list.          acetaminophen 325 MG tablet Commonly known as:  TYLENOL Take 650 mg by mouth every 6 (six) hours as needed for mild pain. 325 mg caplet administer 2 tablets q6h prn for temp greater than 99.5   CALCIUM 500 PO Take 1 tablet by mouth 3 (three) times daily.   celecoxib 200 MG capsule Commonly known as:  CELEBREX Take 200 mg by mouth daily. For rheumatoid arthritits   cetirizine 10 MG tablet Commonly known as:  ZYRTEC Take 10 mg by mouth daily. For allergies   clonazePAM 0.5 MG tablet Commonly known as:  KLONOPIN Take 0.25 mg by mouth at bedtime.   finasteride 5 MG tablet Commonly known as:  PROSCAR Take 5 mg by mouth daily.   hydrocortisone butyrate 0.1 % Crea cream Commonly known as:  LUCOID Apply 1 application topically daily. To bilateral arms For contact dermatitis     KLOR-CON 10 PO Take 1 tablet by mouth daily. For potassium supplement   LANTUS SOLOSTAR 100 UNIT/ML Solostar Pen Generic drug:  Insulin Glargine Inject 33 Units into the skin every morning. Also inject 33 units sq at bedtime   LOPID PO Take 600 mg by mouth 2 (two) times daily. For heart disease   magnesium oxide 400 MG tablet Commonly known as:  MAG-OX Take 400 mg by mouth daily.   metFORMIN 500 MG tablet Commonly known as:  GLUCOPHAGE Give 2 and 1/2 tablets (1250 mg)  po twice daily   metoprolol tartrate 25 MG tablet Commonly known as:  LOPRESSOR Take 25 mg by mouth 2 (two) times daily. For HTN   NITROSTAT 0.4 MG SL tablet Generic drug:  nitroGLYCERIN Place 0.4 mg under the tongue every 5 (five) minutes as needed for chest pain.   PERIDEX 0.12 % solution Generic drug:  chlorhexidine Use as directed 15 mLs in the mouth or throat at bedtime.   PLAQUENIL 200 MG tablet Generic drug:  hydroxychloroquine Take 200 mg by mouth 2 (two) times daily. For rheumatoid arthritis   ranitidine 75 MG tablet Commonly known as:  ZANTAC Take 150 mg by mouth 2 (two) times daily.   tamsulosin 0.4 MG Caps capsule Commonly known as:  FLOMAX Take 0.8 mg by mouth at bedtime.   THERA Tabs Give 1  tablet by mouth daily. multivitains therapeutic   torsemide 5 MG tablet Commonly known as:  DEMADEX Give 1 and 1/2 tablet 7.5 mg po daily for pleural effusion (torsemide)   VITAMIN D-3 PO 50,000 units caps give 1 capsule po every month on 15th. (cholecalciferol)       No orders of the defined types were placed in this encounter.   Immunization History  Administered Date(s) Administered  . Influenza Whole 02/19/2013  . Influenza-Unspecified 02/26/2014, 02/10/2015, 02/14/2016  . PPD Test 03/05/2009  . Pneumococcal-Unspecified 03/17/2010    Social History  Substance Use Topics  . Smoking status: Former Smoker    Packs/day: 0.25    Years: 50.00    Types: Cigarettes    Quit date:  03/19/2013  . Smokeless tobacco: Never Used  . Alcohol use No    Review of Systems  DATA OBTAINED: from patient - no c/o GENERAL:  no fevers, fatigue, appetite changes SKIN: No itching, rash HEENT: No complaint RESPIRATORY: No cough, wheezing, SOB CARDIAC: No chest pain, palpitations, lower extremity edema  GI: No abdominal pain, No N/V/D or constipation, No heartburn or reflux  GU: No dysuria, frequency or urgency, or incontinence  MUSCULOSKELETAL: No unrelieved bone/joint pain NEUROLOGIC: No headache, dizziness  PSYCHIATRIC: No overt anxiety or sadness  Vitals:   02/26/16 1003  BP: 122/69  Pulse: 75  Resp: 18  Temp: 97.5 F (36.4 C)   Body mass index is 31.71 kg/m. Physical Exam  GENERAL APPEARANCE: Alert, conversant, No acute distress  SKIN: No diaphoresis rash HEENT: Unremarkable RESPIRATORY: Breathing is even, unlabored. Lung sounds are clear   CARDIOVASCULAR: Heart RRR no murmurs, rubs or gallops. No peripheral edema  GASTROINTESTINAL: Abdomen is soft, non-tender, not distended w/ normal bowel sounds.  GENITOURINARY: Bladder non tender, not distended  MUSCULOSKELETAL: No abnormal joints or musculature NEUROLOGIC: Cranial nerves 2-12 grossly intact. Moves all extremities; upper limb ataxia and lower ext weakness and stiffness PSYCHIATRIC: Mood and affect appropriate to situation, no behavioral issues  Patient Active Problem List   Diagnosis Date Noted  . 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 Movement disorder 09/20/2015  . Aspiration pneumonia ([place]) 04/23/2015  . UTI (urinary tract infection) 10/26/2014  . PVD (peripheral vascular disease) ([place]) 10/26/2014  . OA (osteoarthritis) 07/20/2014  . Duodenal ulcer 07/20/2014  . CHF (congestive heart failure) ([place]) 07/20/2014  . Pleural effusion 07/20/2014  . AR (allergic rhinitis) 07/20/2014  . Insulin use (long-term) in type 2 diabetes ([place]) 07/20/2014  . Dysphagia, oropharyngeal 06/22/2014  . Anemia of chronic  disease 02/15/2014  . Hypertensive heart disease with CHF (congestive heart failure) ([place]) 02/15/2014  . PNA (pneumonia) 02/15/2014  . Chronic diarrhea 02/15/2014  . DM (diabetes mellitus), type 2 with peripheral vascular complications ([place]) 05/39/7673  . GERD (gastroesophageal reflux disease)   . Rheumatoid arthritis ([place])   . Hyperlipidemia   . BPH (benign prostatic hyperplasia)   . 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 Pulmonary infiltrates 12/22/2012  . COPD  undetermined GOLD status 12/22/2012  . Smoker 12/22/2012    CMP     Component Value Date/Time   NA 138 01/16/2016   K 4.7 01/16/2016   BUN 20 01/16/2016   CREATININE 1.0 01/16/2016   AST 15 06/24/2015   ALT 8 (A) 06/24/2015   ALKPHOS 102 06/24/2015    Recent Labs  07/11/15 01/16/16  NA 140 138  K 4.6 4.7  BUN 21 20  CREATININE 0.7 1.0    Recent Labs  06/24/15  AST 15  ALT 8*  ALKPHOS 102    Recent Labs  06/24/15 01/16/16  WBC 3.3 3.7  HGB 13.2* 13.1*  HCT 41 39*  PLT 345  --     Recent Labs  06/24/15 11/28/15  CHOL 124 123  LDLCALC 70 59  TRIG 151 178*   Lab Results  Component Value Date   MICROALBUR 1 02/04/2016   Lab Results  Component Value Date   TSH 4.05 03/24/2015   Lab Results  Component Value Date   HGBA1C 7.4 11/28/2015   Lab Results  Component Value Date   CHOL 123 11/28/2015   HDL 28 (A) 11/28/2015   LDLCALC 59 11/28/2015   TRIG 178 (A) 11/28/2015    Significant Diagnostic Results in last 30 days:  No results found.  Assessment and Plan  Hypertensive heart disease with CHF (congestive heart failure) Controlled; cont metoprolol 25 mg BID and demadex '5mg'$  daily; BMP in 01/2016 Na 138, K+ 4.7;plan to cont current meds  CHF (congestive heart failure) Pt has had no exacerbations and remains stable on demadex and metoprolol 25 mg BID; pla to cont current meds  Dysphagia, oropharyngeal Has waver so does not drink thickened liquids; had PNA in April but no significant   Aspiration since ;cont to monitor     Shayleigh Bouldin D. Sheppard Coil, MD

## 2016-02-27 ENCOUNTER — Encounter: Payer: Self-pay | Admitting: Internal Medicine

## 2016-02-29 ENCOUNTER — Encounter: Payer: Self-pay | Admitting: Internal Medicine

## 2016-02-29 NOTE — Assessment & Plan Note (Signed)
Has waver so does not drink thickened liquids; had PNA in April but no significant  Aspiration since ;cont to monitor

## 2016-02-29 NOTE — Assessment & Plan Note (Signed)
Pt has had no exacerbations and remains stable on demadex and metoprolol 25 mg BID; pla to cont current meds

## 2016-02-29 NOTE — Assessment & Plan Note (Signed)
Controlled; cont metoprolol 25 mg BID and demadex '5mg'$  daily; BMP in 01/2016 Na 138, K+ 4.7;plan to cont current meds

## 2016-03-29 ENCOUNTER — Non-Acute Institutional Stay (SKILLED_NURSING_FACILITY): Payer: Medicare Other | Admitting: Internal Medicine

## 2016-03-29 ENCOUNTER — Encounter: Payer: Self-pay | Admitting: Internal Medicine

## 2016-03-29 DIAGNOSIS — M159 Polyosteoarthritis, unspecified: Secondary | ICD-10-CM

## 2016-03-29 DIAGNOSIS — M150 Primary generalized (osteo)arthritis: Secondary | ICD-10-CM | POA: Diagnosis not present

## 2016-03-29 DIAGNOSIS — G259 Extrapyramidal and movement disorder, unspecified: Secondary | ICD-10-CM

## 2016-03-29 DIAGNOSIS — M0579 Rheumatoid arthritis with rheumatoid factor of multiple sites without organ or systems involvement: Secondary | ICD-10-CM

## 2016-03-29 NOTE — Progress Notes (Signed)
Location:  [facility] Room Number: [phone] Place of Service:  SNF [phone])  Justin Homes, MD  Patient Care Team: Hennie Duos, MD as PCP - General (Internal Medicine)  Extended Emergency Contact Information Primary Emergency Contact: Osvaldo Shipper Address: [place] [address] [place] Phone: [phone] Relation: Spouse    Allergies: Primaxin [imipenem]  Chief Complaint  Patient presents with  . Medical Management of Chronic Issues    Routine Visit    HPI: Patient is 70 y.o. male who is being seen for routine issues of movement disorder, RA and OA.  Past Medical History:  Diagnosis Date  . BPH (benign prostatic hyperplasia)   . Coronary artery disease   . Diabetes mellitus without complication ([place])   . 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 GERD (gastroesophageal reflux disease)   . Hyperlipidemia   . Rheumatoid arthritis ([place])     No past surgical history on file.    Medication List       Accurate as of 03/29/16 11:59 PM. Always use your most recent med list.          acetaminophen 325 MG tablet Commonly known as:  TYLENOL Take 650 mg by mouth every 6 (six) hours as needed for mild pain. 325 mg caplet administer 2 tablets q6h prn for temp greater than 99.5   CALCIUM 500 PO Take 1 tablet by mouth 3 (three) times daily.   celecoxib 200 MG capsule Commonly known as:  CELEBREX Take 200 mg by mouth daily. For rheumatoid arthritits   cetirizine 10 MG tablet Commonly known as:  ZYRTEC Take 10 mg by mouth daily. For allergies   clonazePAM 0.5 MG tablet Commonly known as:  KLONOPIN Take 0.25 mg by mouth at bedtime.   finasteride 5 MG tablet Commonly known as:  PROSCAR Take 5 mg by mouth daily.   hydrocortisone butyrate 0.1 % Crea cream Commonly known as:  LUCOID Apply 1 application topically daily. To bilateral arms For contact dermatitis     KLOR-CON 10 PO Take 1 tablet by mouth daily. For potassium supplement   LANTUS SOLOSTAR 100 UNIT/ML Solostar Pen Generic drug:  Insulin Glargine Inject 33 Units into the skin every morning. Also inject 33 units sq at bedtime   LOPID PO Take 600 mg by mouth 2 (two) times daily. For heart disease   magnesium oxide 400 MG tablet Commonly known as:  MAG-OX Take 400 mg by mouth daily.   metFORMIN 500 MG tablet Commonly known as:  GLUCOPHAGE Give 2 and 1/2 tablets (1250 mg)  po twice daily   metoprolol tartrate 25 MG tablet Commonly known as:  LOPRESSOR Take 25 mg by mouth 2 (two) times daily. For HTN   neomycin-bacitracin-polymyxin ointment Commonly known as:  NEOSPORIN Apply 1 application topically daily. Apply to inside of each nostril prior to going to bed   NITROSTAT 0.4 MG SL tablet Generic drug:  nitroGLYCERIN Place 0.4 mg under the tongue every 5 (five) minutes as needed for chest pain.   PERIDEX 0.12 % solution Generic drug:  chlorhexidine Use as directed 15 mLs in the mouth or throat at bedtime.   PLAQUENIL 200 MG tablet Generic drug:  hydroxychloroquine Take 200 mg by mouth 2 (two) times daily. For rheumatoid arthritis   ranitidine 75 MG tablet Commonly known as:  ZANTAC Take 150 mg by mouth 2 (two) times daily.  tamsulosin 0.4 MG Caps capsule Commonly known as:  FLOMAX Take 0.8 mg by mouth at bedtime.   THERA Tabs Give 1 tablet by mouth daily. multivitains therapeutic   torsemide 5 MG tablet Commonly known as:  DEMADEX Give 1 and 1/2 tablet 7.5 mg po daily for pleural effusion (torsemide)   VITAMIN D-3 PO 50,000 units caps give 1 capsule po every month on 15th. (cholecalciferol)       Meds ordered this encounter  Medications  . neomycin-bacitracin-polymyxin (NEOSPORIN) ointment    Sig: Apply 1 application topically daily. Apply to inside of each nostril prior to going to bed    Immunization History  Administered Date(s) Administered  .  Influenza Whole 02/19/2013  . Influenza-Unspecified 02/26/2014, 02/10/2015, 02/14/2016  . PPD Test 03/05/2009  . Pneumococcal-Unspecified 03/17/2010    Social History  Substance Use Topics  . Smoking status: Former Smoker    Packs/day: 0.25    Years: 50.00    Types: Cigarettes    Quit date: 03/19/2013  . Smokeless tobacco: Never Used  . Alcohol use No    Review of Systems  DATA OBTAINED: from patient, nurse GENERAL:  no fevers, fatigue, appetite changes SKIN: No itching, rash HEENT: No complaint RESPIRATORY: No cough, wheezing, SOB CARDIAC: No chest pain, palpitations, lower extremity edema  GI: No abdominal pain, No N/V/D or constipation, No heartburn or reflux  GU: No dysuria, frequency or urgency, or incontinence  MUSCULOSKELETAL: No unrelieved bone/joint pain NEUROLOGIC: No headache, dizziness  PSYCHIATRIC: No overt anxiety or sadness  Vitals:   03/29/16 0914  BP: 104/69  Pulse: 70  Resp: 18  Temp: 99.3 F (37.4 C)   Body mass index is 32.23 kg/m. Physical Exam  GENERAL APPEARANCE: Alert, conversant, No acute distress  SKIN: No diaphoresis rash HEENT: Unremarkable RESPIRATORY: Breathing is even, unlabored. Lung sounds are clear   CARDIOVASCULAR: Heart RRR no murmurs, rubs or gallops. No peripheral edema  GASTROINTESTINAL: Abdomen is soft, non-tender, not distended w/ normal bowel sounds.  GENITOURINARY: Bladder non tender, not distended  MUSCULOSKELETAL: arthritic changes, stiffness NEUROLOGIC: Cranial nerves 2-12 grossly intact. Moves all extremities; UE ataxia PSYCHIATRIC: Mood and affect appropriate to situation, no behavioral issues  Patient Active Problem List   Diagnosis Date Noted  . 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 Movement disorder 09/20/2015  . Aspiration pneumonia ([place]) 04/23/2015  . UTI (urinary tract infection) 10/26/2014  . PVD (peripheral vascular disease) ([place]) 10/26/2014  . OA (osteoarthritis) 07/20/2014  . Duodenal ulcer  07/20/2014  . CHF (congestive heart failure) ([place]) 07/20/2014  . Pleural effusion 07/20/2014  . AR (allergic rhinitis) 07/20/2014  . Insulin use (long-term) in type 2 diabetes ([place]) 07/20/2014  . Dysphagia, oropharyngeal 06/22/2014  . Anemia of chronic disease 02/15/2014  . Hypertensive heart disease with CHF (congestive heart failure) ([place]) 02/15/2014  . PNA (pneumonia) 02/15/2014  . Chronic diarrhea 02/15/2014  . DM (diabetes mellitus), type 2 with peripheral vascular complications ([place]) 78/46/9629  . GERD (gastroesophageal reflux disease)   . Rheumatoid arthritis ([place])   . Hyperlipidemia   . BPH (benign prostatic hyperplasia)   . 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 Pulmonary infiltrates 12/22/2012  . COPD  undetermined GOLD status 12/22/2012  . Smoker 12/22/2012    CMP     Component Value Date/Time   NA 138 01/16/2016   K 4.7 01/16/2016   BUN 20 01/16/2016   CREATININE 1.0 01/16/2016   AST 15 06/24/2015   ALT 8 (A) 06/24/2015   ALKPHOS 102 06/24/2015  Recent Labs  07/11/15 01/16/16  NA 140 138  K 4.6 4.7  BUN 21 20  CREATININE 0.7 1.0    Recent Labs  06/24/15  AST 15  ALT 8*  ALKPHOS 102    Recent Labs  06/24/15 01/16/16  WBC 3.3 3.7  HGB 13.2* 13.1*  HCT 41 39*  PLT 345  --     Recent Labs  06/24/15 11/28/15  CHOL 124 123  LDLCALC 70 59  TRIG 151 178*   Lab Results  Component Value Date   MICROALBUR 1 02/04/2016   Lab Results  Component Value Date   TSH 4.05 03/24/2015   Lab Results  Component Value Date   HGBA1C 7.4 11/28/2015   Lab Results  Component Value Date   CHOL 123 11/28/2015   HDL 28 (A) 11/28/2015   LDLCALC 59 11/28/2015   TRIG 178 (A) 11/28/2015    Significant Diagnostic Results in last 30 days:  No results found.  Assessment and Plan  Movement disorder Reasonably controlled with clonazepam 0.25 qHS ;neither he or his wife have voiced concern  Rheumatoid arthritis Symptoms appear to be controlled on plaquenil  200 mg BIDand celebrex 200 mg daily ;plan to cont both med  OA (osteoarthritis) Chronic and ongoing in setting of RA and eaton-lambert syndrome; pt has tylenol and celebrex for pain-he never asks for more;wife would tell me if he needed more meds     Terriann Difonzo D. Sheppard Coil, MD

## 2016-03-31 ENCOUNTER — Non-Acute Institutional Stay (SKILLED_NURSING_FACILITY): Payer: Medicare Other | Admitting: Internal Medicine

## 2016-03-31 ENCOUNTER — Encounter: Payer: Self-pay | Admitting: Internal Medicine

## 2016-03-31 DIAGNOSIS — R600 Localized edema: Secondary | ICD-10-CM

## 2016-03-31 NOTE — Progress Notes (Signed)
Location:  [facility] Room Number: [phone] Place of Service:  SNF [phone])  [name], MD  Patient Care Team: Hennie Duos, MD as PCP - General (Internal Medicine)  Extended Emergency Contact Information Primary Emergency Contact: Osvaldo Shipper Address: [address] [address] [place] Phone: [phone] Relation: Spouse    Allergies: Primaxin [imipenem]  Chief Complaint  Patient presents with  . Acute Visit    Acute    HPI: Patient is 70 y.o. male who is being seen acutely for wife's concern over LE edema. Pt says he thinks it is about the same as usual. Pt admits that he is willing to wear TED hose.  Past Medical History:  Diagnosis Date  . BPH (benign prostatic hyperplasia)   . Coronary artery disease   . Diabetes mellitus without complication ([place])   . 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 GERD (gastroesophageal reflux disease)   . Hyperlipidemia   . Rheumatoid arthritis ([place])     No past surgical history on file.    Medication List       Accurate as of 03/31/16  2:24 PM. Always use your most recent med list.          acetaminophen 325 MG tablet Commonly known as:  TYLENOL Take 650 mg by mouth every 6 (six) hours as needed for mild pain. 325 mg caplet administer 2 tablets q6h prn for temp greater than 99.5   CALCIUM 500 PO Take 1 tablet by mouth 3 (three) times daily.   celecoxib 200 MG capsule Commonly known as:  CELEBREX Take 200 mg by mouth daily. For rheumatoid arthritits   cetirizine 10 MG tablet Commonly known as:  ZYRTEC Take 10 mg by mouth daily. For allergies   clonazePAM 0.5 MG tablet Commonly known as:  KLONOPIN Take 0.25 mg by mouth at bedtime.   finasteride 5 MG tablet Commonly known as:  PROSCAR Take 5 mg by mouth daily.   hydrocortisone butyrate 0.1 % Crea cream Commonly known as:  LUCOID Apply 1 application topically  daily. To bilateral arms For contact dermatitis   KLOR-CON 10 PO Take 1 tablet by mouth daily. For potassium supplement   LANTUS SOLOSTAR 100 UNIT/ML Solostar Pen Generic drug:  Insulin Glargine Inject 33 Units into the skin every morning. Also inject 33 units sq at bedtime   LOPID PO Take 600 mg by mouth 2 (two) times daily. For heart disease   magnesium oxide 400 MG tablet Commonly known as:  MAG-OX Take 400 mg by mouth daily.   metFORMIN 500 MG tablet Commonly known as:  GLUCOPHAGE Give 2 and 1/2 tablets (1250 mg)  po twice daily   metoprolol tartrate 25 MG tablet Commonly known as:  LOPRESSOR Take 25 mg by mouth 2 (two) times daily. For HTN   neomycin-bacitracin-polymyxin ointment Commonly known as:  NEOSPORIN Apply 1 application topically daily. Apply to inside of each nostril prior to going to bed   NITROSTAT 0.4 MG SL tablet Generic drug:  nitroGLYCERIN Place 0.4 mg under the tongue every 5 (five) minutes as needed for chest pain.   PERIDEX 0.12 % solution Generic drug:  chlorhexidine Use as directed 15 mLs in the mouth or throat at bedtime.   PLAQUENIL 200 MG tablet Generic drug:  hydroxychloroquine Take 200 mg by mouth 2 (two) times daily. For rheumatoid arthritis   ranitidine 75 MG  tablet Commonly known as:  ZANTAC Take 150 mg by mouth 2 (two) times daily.   tamsulosin 0.4 MG Caps capsule Commonly known as:  FLOMAX Take 0.8 mg by mouth at bedtime.   THERA Tabs Give 1 tablet by mouth daily. multivitains therapeutic   torsemide 5 MG tablet Commonly known as:  DEMADEX Give 1 and 1/2 tablet 7.5 mg po daily for pleural effusion (torsemide)   VITAMIN D-3 PO 50,000 units caps give 1 capsule po every month on 15th. (cholecalciferol)       No orders of the defined types were placed in this encounter.   Immunization History  Administered Date(s) Administered  . Influenza Whole 02/19/2013  . Influenza-Unspecified 02/26/2014, 02/10/2015, 02/14/2016  .  PPD Test 03/05/2009  . Pneumococcal-Unspecified 03/17/2010    Social History  Substance Use Topics  . Smoking status: Former Smoker    Packs/day: 0.25    Years: 50.00    Types: Cigarettes    Quit date: 03/19/2013  . Smokeless tobacco: Never Used  . Alcohol use No    Review of Systems  DATA OBTAINED: from patient, wife GENERAL:  no fevers, fatigue, appetite changes SKIN: No itching, rash HEENT: No complaint RESPIRATORY: No cough, wheezing, SOB CARDIAC: No chest pain, palpitations, +lower extremity edema  GI: No abdominal pain, No N/V/D or constipation, No heartburn or reflux  GU: No dysuria, frequency or urgency, or incontinence  MUSCULOSKELETAL: No unrelieved bone/joint pain NEUROLOGIC: No headache, dizziness  PSYCHIATRIC: No overt anxiety or sadness  Vitals:   03/31/16 1422  BP: 104/69  Pulse: 70  Resp: 18  Temp: 99.3 F (37.4 C)   Body mass index is 32.23 kg/m. Physical Exam  GENERAL APPEARANCE: Alert, conversant, No acute distress  SKIN: No diaphoresis rash HEENT: Unremarkable RESPIRATORY: Breathing is even, unlabored. Lung sounds are clear   CARDIOVASCULAR: Heart RRR no murmurs, rubs or gallops. 1= peripheral edema  GASTROINTESTINAL: Abdomen is soft, non-tender, not distended w/ normal bowel sounds.  GENITOURINARY: Bladder non tender, not distended  MUSCULOSKELETAL: No abnormal joints or musculature NEUROLOGIC: Cranial nerves 2-12 grossly intact. Moves all extremities; UE ataxia PSYCHIATRIC: Mood and affect appropriate to situation, no behavioral issues  Patient Active Problem List   Diagnosis Date Noted  . 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 Movement disorder 09/20/2015  . Aspiration pneumonia ([place]) 04/23/2015  . UTI (urinary tract infection) 10/26/2014  . PVD (peripheral vascular disease) ([place]) 10/26/2014  . OA (osteoarthritis) 07/20/2014  . Duodenal ulcer 07/20/2014  . CHF (congestive heart failure) ([place]) 07/20/2014  . Pleural effusion 07/20/2014    . AR (allergic rhinitis) 07/20/2014  . Insulin use (long-term) in type 2 diabetes ([place]) 07/20/2014  . Dysphagia, oropharyngeal 06/22/2014  . Anemia of chronic disease 02/15/2014  . Hypertensive heart disease with CHF (congestive heart failure) ([place]) 02/15/2014  . PNA (pneumonia) 02/15/2014  . Chronic diarrhea 02/15/2014  . DM (diabetes mellitus), type 2 with peripheral vascular complications ([place]) 54/00/8676  . GERD (gastroesophageal reflux disease)   . Rheumatoid arthritis ([place])   . Hyperlipidemia   . BPH (benign prostatic hyperplasia)   . 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 Pulmonary infiltrates 12/22/2012  . COPD  undetermined GOLD status 12/22/2012  . Smoker 12/22/2012    CMP     Component Value Date/Time   NA 138 01/16/2016   K 4.7 01/16/2016   BUN 20 01/16/2016   CREATININE 1.0 01/16/2016   AST 15 06/24/2015   ALT 8 (A) 06/24/2015   ALKPHOS 102 06/24/2015  Recent Labs  07/11/15 01/16/16  NA 140 138  K 4.6 4.7  BUN 21 20  CREATININE 0.7 1.0    Recent Labs  06/24/15  AST 15  ALT 8*  ALKPHOS 102    Recent Labs  06/24/15 01/16/16  WBC 3.3 3.7  HGB 13.2* 13.1*  HCT 41 39*  PLT 345  --     Recent Labs  06/24/15 11/28/15  CHOL 124 123  LDLCALC 70 59  TRIG 151 178*   Lab Results  Component Value Date   MICROALBUR 1 02/04/2016   Lab Results  Component Value Date   TSH 4.05 03/24/2015   Lab Results  Component Value Date   HGBA1C 7.4 11/28/2015   Lab Results  Component Value Date   CHOL 123 11/28/2015   HDL 28 (A) 11/28/2015   LDLCALC 59 11/28/2015   TRIG 178 (A) 11/28/2015    Significant Diagnostic Results in last 30 days:  No results found.  Assessment and   LE EDEMA - do not want to use lasix; pt is amenable to TED hose; have ordered extra extra large TED hose for daily iuse    Romario Tith D. Sheppard Coil, MD

## 2016-04-03 ENCOUNTER — Encounter: Payer: Self-pay | Admitting: Internal Medicine

## 2016-04-03 NOTE — Assessment & Plan Note (Signed)
Chronic and ongoing in setting of RA and eaton-lambert syndrome; pt has tylenol and celebrex for pain-he never asks for more;wife would tell me if he needed more meds

## 2016-04-03 NOTE — Assessment & Plan Note (Signed)
Reasonably controlled with clonazepam 0.25 qHS ;neither he or his wife have voiced concern

## 2016-04-03 NOTE — Assessment & Plan Note (Signed)
Symptoms appear to be controlled on plaquenil 200 mg BIDand celebrex 200 mg daily ;plan to cont both med

## 2016-04-14 LAB — HEPATIC FUNCTION PANEL
ALK PHOS: 149 U/L — AB (ref 25–125)
ALT: 6 U/L — AB (ref 10–40)
AST: 13 U/L — AB (ref 14–40)
BILIRUBIN, TOTAL: 0.2 mg/dL

## 2016-04-14 LAB — BASIC METABOLIC PANEL
BUN: 19 mg/dL (ref 4–21)
CREATININE: 0.7 mg/dL (ref 0.6–1.3)
GLUCOSE: 297 mg/dL
Potassium: 5.2 mmol/L (ref 3.4–5.3)
Sodium: 137 mmol/L (ref 137–147)

## 2016-04-14 LAB — TSH: TSH: 3.72 u[IU]/mL (ref 0.41–5.90)

## 2016-04-14 LAB — HEMOGLOBIN A1C: HEMOGLOBIN A1C: 8.4

## 2016-04-29 ENCOUNTER — Non-Acute Institutional Stay (SKILLED_NURSING_FACILITY): Payer: Medicare Other | Admitting: Internal Medicine

## 2016-04-29 ENCOUNTER — Encounter: Payer: Self-pay | Admitting: Internal Medicine

## 2016-04-29 DIAGNOSIS — G119 Hereditary ataxia, unspecified: Secondary | ICD-10-CM

## 2016-04-29 DIAGNOSIS — N400 Enlarged prostate without lower urinary tract symptoms: Secondary | ICD-10-CM | POA: Diagnosis not present

## 2016-04-29 DIAGNOSIS — I5032 Chronic diastolic (congestive) heart failure: Secondary | ICD-10-CM

## 2016-04-29 NOTE — Progress Notes (Signed)
Location:  [facility] Room Number: [phone] Place of Service:  SNF [phone])  [name], MD  Patient Care Team: Hennie Duos, MD as PCP - General (Internal Medicine)  Extended Emergency Contact Information Primary Emergency Contact: Osvaldo Shipper Address: [place] [address] [place] Phone: [phone] Relation: Spouse    Allergies: Primaxin [imipenem]  Chief Complaint  Patient presents with  . Medical Management of Chronic Issues    Routine Visit    HPI: Patient is 70 y.o. male who is being seen for toutine issues of BPH, celebellar ataxia and chronic diastolic CHF.  Past Medical History:  Diagnosis Date  . BPH (benign prostatic hyperplasia)   . Coronary artery disease   . Diabetes mellitus without complication ([place])   . 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 GERD (gastroesophageal reflux disease)   . Hyperlipidemia   . Rheumatoid arthritis ([place])     No past surgical history on file.  Allergies as of 04/29/2016      Reactions   Primaxin [imipenem]       Medication List       Accurate as of 04/29/16 11:59 PM. Always use your most recent med list.          acetaminophen 325 MG tablet Commonly known as:  TYLENOL Take 650 mg by mouth every 6 (six) hours as needed for mild pain. 325 mg caplet administer 2 tablets q6h prn for temp greater than 99.5   CALCIUM 500 PO Take 1 tablet by mouth 3 (three) times daily.   celecoxib 200 MG capsule Commonly known as:  CELEBREX Take 200 mg by mouth daily. For rheumatoid arthritits   cetirizine 10 MG tablet Commonly known as:  ZYRTEC Take 10 mg by mouth daily. For allergies   clonazePAM 0.5 MG tablet Commonly known as:  KLONOPIN Take 0.25 mg by mouth at bedtime.   finasteride 5 MG tablet Commonly known as:  PROSCAR Take 5 mg by mouth daily.   gemfibrozil 600 MG tablet Commonly known as:  LOPID Take  600 mg by mouth 2 (two) times daily before a meal.   hydrocortisone butyrate 0.1 % Crea cream Commonly known as:  LUCOID Apply 1 application topically daily. To bilateral arms For contact dermatitis   KLOR-CON 10 PO Take 1 tablet by mouth daily. For potassium supplement   LANTUS SOLOSTAR 100 UNIT/ML Solostar Pen Generic drug:  Insulin Glargine Inject 33 Units into the skin every morning. Also inject 33 units sq at bedtime   magnesium oxide 400 MG tablet Commonly known as:  MAG-OX Take 400 mg by mouth daily.   metFORMIN 500 MG tablet Commonly known as:  GLUCOPHAGE Give 2 and 1/2 tablets (1250 mg)  po twice daily   metoprolol tartrate 25 MG tablet Commonly known as:  LOPRESSOR Take 25 mg by mouth 2 (two) times daily. For HTN   NITROSTAT 0.4 MG SL tablet Generic drug:  nitroGLYCERIN Place 0.4 mg under the tongue every 5 (five) minutes as needed for chest pain.   PERIDEX 0.12 % solution Generic drug:  chlorhexidine Use as directed 15 mLs in the mouth or throat at bedtime.   PLAQUENIL 200 MG tablet Generic drug:  hydroxychloroquine Take 200 mg by mouth 2 (two) times daily. For rheumatoid arthritis   ranitidine 75 MG tablet Commonly known as:  ZANTAC Take 150 mg by mouth 2 (two) times  daily.   tamsulosin 0.4 MG Caps capsule Commonly known as:  FLOMAX Take 0.8 mg by mouth at bedtime.   THERA Tabs Give 1 tablet by mouth daily. multivitains therapeutic   torsemide 5 MG tablet Commonly known as:  DEMADEX Give 1 and 1/2 tablet 7.5 mg po daily for pleural effusion (torsemide)   Vitamin D3 50000 units Caps Take 1 capsule by mouth every 30 (thirty) days. On the 15th of every month       Meds ordered this encounter  Medications  . gemfibrozil (LOPID) 600 MG tablet    Sig: Take 600 mg by mouth 2 (two) times daily before a meal.  . Cholecalciferol (VITAMIN D3) 50000 units CAPS    Sig: Take 1 capsule by mouth every 30 (thirty) days. On the 15th of every month     Immunization History  Administered Date(s) Administered  . Influenza Whole 02/19/2013  . Influenza-Unspecified 02/26/2014, 02/10/2015, 02/14/2016  . PPD Test 03/05/2009  . Pneumococcal-Unspecified 03/17/2010    Social History  Substance Use Topics  . Smoking status: Former Smoker    Packs/day: 0.25    Years: 50.00    Types: Cigarettes    Quit date: 03/19/2013  . Smokeless tobacco: Never Used  . 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 Physical Exam  GENERAL APPEARANCE: Alert, conversant, No acute distress  SKIN: No diaphoresis rash HEENT: Unremarkable RESPIRATORY: Breathing is even, unlabored. Lung sounds are clear   CARDIOVASCULAR: Heart RRR no murmurs, rubs or gallops. No peripheral edema  GASTROINTESTINAL: Abdomen is soft, non-tender, not distended w/ normal bowel sounds.  GENITOURINARY: Bladder non tender, not distended  MUSCULOSKELETAL: No abnormal joints or musculature NEUROLOGIC: Cranial nerves 2-12 grossly intact ; in WC daily -some movement of BLE that ate very ftiff and movement of BUE with ataxia PSYCHIATRIC: Mood and affect appropriate to situation, no behavioral issues  Patient Active Problem List   Diagnosis Date Noted  . 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 Movement disorder 09/20/2015  . Aspiration pneumonia ([place]) 04/23/2015   . UTI (urinary tract infection) 10/26/2014  . PVD (peripheral vascular disease) ([place]) 10/26/2014  . OA (osteoarthritis) 07/20/2014  . Duodenal ulcer 07/20/2014  . CHF (congestive heart failure) ([place]) 07/20/2014  . Pleural effusion 07/20/2014  . AR (allergic rhinitis) 07/20/2014  . Insulin use (long-term) in type 2 diabetes ([place]) 07/20/2014  . Dysphagia, oropharyngeal 06/22/2014  . Anemia of chronic disease 02/15/2014  . Hypertensive heart disease with CHF (congestive heart failure) ([place]) 02/15/2014  . PNA (pneumonia) 02/15/2014  . Chronic diarrhea 02/15/2014  . DM (diabetes mellitus), type 2 with peripheral vascular complications ([place]) 62/94/7654  . GERD (gastroesophageal reflux disease)   . Rheumatoid arthritis ([place])   . Hyperlipidemia   . BPH (benign prostatic hyperplasia)   . 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 Pulmonary infiltrates 12/22/2012  . COPD  undetermined GOLD status 12/22/2012  . Smoker 12/22/2012    CMP     Component Value Date/Time   NA 137 04/14/2016   K 5.2 04/14/2016   BUN 19 04/14/2016   CREATININE 0.7 04/14/2016   AST 13 (A) 04/14/2016   ALT 6 (A) 04/14/2016   ALKPHOS 149 (A) 04/14/2016    Recent Labs  07/11/15 01/16/16 04/14/16  NA 140 138 137  K 4.6 4.7 5.2  BUN '21 20 19  '$ CREATININE 0.7 1.0 0.7    Recent Labs  06/24/15 04/14/16  AST 15 13*  ALT 8* 6*  ALKPHOS 102 149*    Recent Labs  06/24/15 01/16/16  WBC 3.3 3.7  HGB 13.2* 13.1*  HCT 41 39*  PLT 345  --     Recent Labs  06/24/15 11/28/15  CHOL 124 123  LDLCALC 70 59  TRIG 151 178*   Lab Results  Component Value Date   MICROALBUR 1 02/04/2016   Lab Results  Component Value Date   TSH 3.72 04/14/2016   Lab Results  Component Value Date   HGBA1C 8.4 04/14/2016   Lab Results  Component Value Date   CHOL 123 11/28/2015   HDL 28 (A) 11/28/2015   LDLCALC 59 11/28/2015   TRIG 178 (A) 11/28/2015    Significant Diagnostic Results in last 30 days:  No results  found.  Assessment and Plan  BPH (benign prostatic hyperplasia) No reported infections; plan to cont flomax 0.4 mg daily and proscar 5 mg daily  Cerebellar ataxia (HCC) UE ataxia appears stable;plan to cont clonazepam 0.25  CHF (congestive heart failure) Pt has had no reported exacerbations;plan to cont demedex 7.5 mg daily and metoprolol 25 mg BID    Domonick Sittner D. Sheppard Coil, MD

## 2016-05-22 ENCOUNTER — Encounter: Payer: Self-pay | Admitting: Internal Medicine

## 2016-05-22 NOTE — Assessment & Plan Note (Signed)
Pt has had no reported exacerbations;plan to cont demedex 7.5 mg daily and metoprolol 25 mg BID

## 2016-05-22 NOTE — Assessment & Plan Note (Signed)
UE ataxia appears stable;plan to cont clonazepam 0.25

## 2016-05-22 NOTE — Assessment & Plan Note (Addendum)
No reported infections; plan to cont flomax 0.4 mg daily and proscar 5 mg daily

## 2016-06-07 ENCOUNTER — Encounter: Payer: Self-pay | Admitting: Internal Medicine

## 2016-06-07 ENCOUNTER — Non-Acute Institutional Stay (SKILLED_NURSING_FACILITY): Payer: Medicare Other | Admitting: Internal Medicine

## 2016-06-07 DIAGNOSIS — E1151 Type 2 diabetes mellitus with diabetic peripheral angiopathy without gangrene: Secondary | ICD-10-CM | POA: Diagnosis not present

## 2016-06-07 DIAGNOSIS — J3089 Other allergic rhinitis: Secondary | ICD-10-CM

## 2016-06-07 DIAGNOSIS — K219 Gastro-esophageal reflux disease without esophagitis: Secondary | ICD-10-CM | POA: Diagnosis not present

## 2016-06-07 NOTE — Progress Notes (Signed)
Location:  [facility] Room Number: [phone] Place of Service:  SNF [phone])  [name], MD  Patient Care Team: Hennie Duos, MD as PCP - General (Internal Medicine)  Extended Emergency Contact Information Primary Emergency Contact: Osvaldo Shipper Address: [place] [address] [place] Phone: [phone] Relation: Spouse    Allergies: Primaxin [imipenem]  Chief Complaint  Patient presents with  . Medical Management of Chronic Issues    Routine Visit    HPI: Patient is 71 y.o. male who is being seen for routine issue of AR, DM2 and GERD.  Past Medical History:  Diagnosis Date  . BPH (benign prostatic hyperplasia)   . Coronary artery disease   . Diabetes mellitus without complication ([place])   . 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 GERD (gastroesophageal reflux disease)   . Hyperlipidemia   . Rheumatoid arthritis ([place])     No past surgical history on file.  Allergies as of 06/07/2016      Reactions   Primaxin [imipenem]       Medication List       Accurate as of 06/07/16 11:59 PM. Always use your most recent med list.          acetaminophen 325 MG tablet Commonly known as:  TYLENOL Take 650 mg by mouth every 6 (six) hours as needed for mild pain. 325 mg caplet administer 2 tablets q6h prn for temp greater than 99.5   CALCIUM 500 PO Take 1 tablet by mouth 3 (three) times daily.   celecoxib 200 MG capsule Commonly known as:  CELEBREX Take 200 mg by mouth daily. For rheumatoid arthritits   cetirizine 10 MG tablet Commonly known as:  ZYRTEC Take 10 mg by mouth daily. For allergies   clonazePAM 0.5 MG tablet Commonly known as:  KLONOPIN Take 0.25 mg by mouth at bedtime.   finasteride 5 MG tablet Commonly known as:  PROSCAR Take 5 mg by mouth daily.   gemfibrozil 600 MG tablet Commonly known as:  LOPID Take 600 mg by mouth 2 (two) times daily  before a meal.   hydrocortisone butyrate 0.1 % Crea cream Commonly known as:  LUCOID Apply 1 application topically daily. To bilateral arms For contact dermatitis   KLOR-CON 10 PO Take 1 tablet by mouth daily. For potassium supplement   LANTUS SOLOSTAR 100 UNIT/ML Solostar Pen Generic drug:  Insulin Glargine Inject 33 Units into the skin every morning. Also inject 33 units sq at bedtime   magnesium oxide 400 MG tablet Commonly known as:  MAG-OX Take 400 mg by mouth daily.   metFORMIN 500 MG tablet Commonly known as:  GLUCOPHAGE Give 2 and 1/2 tablets (1250 mg)  po twice daily   metoprolol tartrate 25 MG tablet Commonly known as:  LOPRESSOR Take 25 mg by mouth 2 (two) times daily. For HTN   NITROSTAT 0.4 MG SL tablet Generic drug:  nitroGLYCERIN Place 0.4 mg under the tongue every 5 (five) minutes as needed for chest pain.   PERIDEX 0.12 % solution Generic drug:  chlorhexidine Use as directed 15 mLs in the mouth or throat at bedtime.   PLAQUENIL 200 MG tablet Generic drug:  hydroxychloroquine Take 200 mg by mouth 2 (two) times daily. For rheumatoid arthritis   ranitidine 75 MG tablet Commonly known as:  ZANTAC Take 150 mg by mouth 2 (two) times daily.  tamsulosin 0.4 MG Caps capsule Commonly known as:  FLOMAX Take 0.8 mg by mouth at bedtime.   THERA Tabs Give 1 tablet by mouth daily. multivitains therapeutic   torsemide 5 MG tablet Commonly known as:  DEMADEX Give 1 and 1/2 tablet 7.5 mg po daily for pleural effusion (torsemide)   Vitamin D3 50000 units Caps Take 1 capsule by mouth every 30 (thirty) days. On the 15th of every month       No orders of the defined types were placed in this encounter.   Immunization History  Administered Date(s) Administered  . Influenza Whole 02/19/2013  . Influenza-Unspecified 02/26/2014, 02/10/2015, 02/14/2016  . PPD Test 03/05/2009  . Pneumococcal-Unspecified 03/17/2010    Social History  Substance Use Topics  .  Smoking status: Former Smoker    Packs/day: 0.25    Years: 50.00    Types: Cigarettes    Quit date: 03/19/2013  . Smokeless tobacco: Never Used  . Alcohol use No    Review of Systems  DATA OBTAINED: from patient, nurse GENERAL:  no fevers, fatigue, appetite changes SKIN: No itching, rash HEENT: No complaint RESPIRATORY: No cough, wheezing, SOB CARDIAC: No chest pain, palpitations, lower extremity edema  GI: No abdominal pain, No N/V/D or constipation, No heartburn or reflux  GU: No dysuria, frequency or urgency, or incontinence  MUSCULOSKELETAL: No unrelieved bone/joint pain NEUROLOGIC: No headache, dizziness  PSYCHIATRIC: No overt anxiety or sadness  Vitals:   06/07/16 1113  BP: (!) 145/72  Pulse: 85  Resp: 20  Temp: 98 F (36.7 C)   Body mass index is 31.54 kg/m. Physical Exam  GENERAL APPEARANCE: Alert, conversant, No acute distress  SKIN: No diaphoresis rash HEENT: Unremarkable RESPIRATORY: Breathing is even, unlabored. Lung sounds are clear   CARDIOVASCULAR: Heart RRR no murmurs, rubs or gallops. No peripheral edema  GASTROINTESTINAL: Abdomen is soft, non-tender, not distended w/ normal bowel sounds.  GENITOURINARY: Bladder non tender, not distended  MUSCULOSKELETAL: diffuse joint stiffness NEUROLOGIC: Cranial nerves 2-12 grossly intact; BUE ataxia; decreased movement BLE PSYCHIATRIC: Mood and affect appropriate to situation, no behavioral issues  Patient Active Problem List   Diagnosis Date Noted  . 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 Movement disorder 09/20/2015  . Aspiration pneumonia ([place]) 04/23/2015  . UTI (urinary tract infection) 10/26/2014  . PVD (peripheral vascular disease) ([place]) 10/26/2014  . OA (osteoarthritis) 07/20/2014  . Duodenal ulcer 07/20/2014  . CHF (congestive heart failure) ([place]) 07/20/2014  . Pleural effusion 07/20/2014  . AR (allergic rhinitis) 07/20/2014  . Insulin use (long-term) in type 2 diabetes ([place]) 07/20/2014  .  Dysphagia, oropharyngeal 06/22/2014  . Anemia of chronic disease 02/15/2014  . Hypertensive heart disease with CHF (congestive heart failure) ([place]) 02/15/2014  . PNA (pneumonia) 02/15/2014  . Chronic diarrhea 02/15/2014  . DM (diabetes mellitus), type 2 with peripheral vascular complications ([place]) 57/84/6962  . GERD (gastroesophageal reflux disease)   . Rheumatoid arthritis ([place])   . Hyperlipidemia   . BPH (benign prostatic hyperplasia)   . 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 Pulmonary infiltrates 12/22/2012  . COPD  undetermined GOLD status 12/22/2012  . Smoker 12/22/2012    CMP     Component Value Date/Time   NA 137 04/14/2016   K 5.2 04/14/2016   BUN 19 04/14/2016   CREATININE 0.7 04/14/2016   AST 13 (A) 04/14/2016   ALT 6 (A) 04/14/2016   ALKPHOS 149 (A) 04/14/2016    Recent Labs  07/11/15 01/16/16 04/14/16  NA 140 138 137  K 4.6 4.7 5.2  BUN '21 20 19  '$ CREATININE 0.7 1.0 0.7    Recent Labs  04/14/16  AST 13*  ALT 6*  ALKPHOS 149*    Recent Labs  01/16/16  WBC 3.7  HGB 13.1*  HCT 39*    Recent Labs  11/28/15  CHOL 123  LDLCALC 59  TRIG 178*   Lab Results  Component Value Date   MICROALBUR 1 02/04/2016   Lab Results  Component Value Date   TSH 3.72 04/14/2016   Lab Results  Component Value Date   HGBA1C 8.4 04/14/2016   Lab Results  Component Value Date   CHOL 123 11/28/2015   HDL 28 (A) 11/28/2015   LDLCALC 59 11/28/2015   TRIG 178 (A) 11/28/2015    Significant Diagnostic Results in last 30 days:  No results found.  Assessment and Plan  AR (allergic rhinitis) Controlled; plan to cont zyrtec 10 mg daily for chronic sinusitis per ENT  DM (diabetes mellitus), type 2 with peripheral vascular complications A7W was 8.4; needs better; will d/c lantus, start toujeo 35 u qhs and monitor ac and qHS  GERD (gastroesophageal reflux disease) Aspiratioj risk 2/2 parkinsons; cnt zantac 150 mg BID    Noah Delaine. Sheppard Coil,  MD

## 2016-06-09 LAB — LIPID PANEL
Cholesterol: 141 mg/dL (ref 0–200)
HDL: 28 mg/dL — AB (ref 35–70)
LDL Cholesterol: 81 mg/dL
Triglycerides: 159 mg/dL (ref 40–160)

## 2016-06-09 LAB — VITAMIN D 25 HYDROXY (VIT D DEFICIENCY, FRACTURES): Vit D, 25-Hydroxy: 29.44

## 2016-06-16 ENCOUNTER — Non-Acute Institutional Stay (SKILLED_NURSING_FACILITY): Payer: Medicare Other | Admitting: Internal Medicine

## 2016-06-16 DIAGNOSIS — Z20828 Contact with and (suspected) exposure to other viral communicable diseases: Secondary | ICD-10-CM

## 2016-06-24 LAB — BASIC METABOLIC PANEL
BUN: 15 mg/dL (ref 4–21)
CREATININE: 0.7 mg/dL (ref 0.6–1.3)
Glucose: 114 mg/dL
Potassium: 4.8 mmol/L (ref 3.4–5.3)
Sodium: 141 mmol/L (ref 137–147)

## 2016-06-24 LAB — CBC AND DIFFERENTIAL
HCT: 45 % (ref 41–53)
Hemoglobin: 14.6 g/dL (ref 13.5–17.5)
PLATELETS: 330 10*3/uL (ref 150–399)
WBC: 3.8 10*3/mL

## 2016-06-26 ENCOUNTER — Encounter: Payer: Self-pay | Admitting: Internal Medicine

## 2016-06-26 NOTE — Assessment & Plan Note (Signed)
A1c was 8.4; needs better; will d/c lantus, start toujeo 35 u qhs and monitor ac and qHS

## 2016-06-26 NOTE — Assessment & Plan Note (Signed)
Controlled; plan to cont zyrtec 10 mg daily for chronic sinusitis per ENT

## 2016-06-26 NOTE — Assessment & Plan Note (Signed)
Aspiratioj risk 2/2 parkinsons; cnt zantac 150 mg BID

## 2016-07-06 ENCOUNTER — Non-Acute Institutional Stay (SKILLED_NURSING_FACILITY): Payer: Medicare Other | Admitting: Internal Medicine

## 2016-07-06 ENCOUNTER — Encounter: Payer: Self-pay | Admitting: Internal Medicine

## 2016-07-06 DIAGNOSIS — I110 Hypertensive heart disease with heart failure: Secondary | ICD-10-CM | POA: Diagnosis not present

## 2016-07-06 DIAGNOSIS — G7080 Lambert-Eaton syndrome, unspecified: Secondary | ICD-10-CM | POA: Diagnosis not present

## 2016-07-06 DIAGNOSIS — E785 Hyperlipidemia, unspecified: Secondary | ICD-10-CM | POA: Diagnosis not present

## 2016-07-06 NOTE — Progress Notes (Signed)
Location:  [facility] Room Number: [phone] Place of Service:  SNF [phone])  Justin Homes, MD  Patient Care Team: Hennie Duos, MD as PCP - General (Internal Medicine)  Extended Emergency Contact Information Primary Emergency Contact: Osvaldo Shipper Address: [address] [address] [place] Phone: [phone] Relation: Spouse    Allergies: Primaxin [imipenem]  Chief Complaint  Patient presents with  . Medical Management of Chronic Issues    Routine Visit    HPI: Patient is 71 y.o. male who is being seen for routine issues of eaton-lambert syndrome, HLD and HTN.  Past Medical History:  Diagnosis Date  . BPH (benign prostatic hyperplasia)   . Coronary artery disease   . Diabetes mellitus without complication ([facility])   . 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 GERD (gastroesophageal reflux disease)   . Hyperlipidemia   . Rheumatoid arthritis ([facility])     No past surgical history on file.  Allergies as of 07/06/2016      Reactions   Primaxin [imipenem]       Medication List       Accurate as of 07/06/16 11:59 PM. Always use your most recent med list.          acetaminophen 325 MG tablet Commonly known as:  TYLENOL Take 650 mg by mouth every 6 (six) hours as needed for mild pain. 325 mg caplet administer 2 tablets q6h prn for temp greater than 99.5   CALCIUM 500 PO Take 1 tablet by mouth 3 (three) times daily.   celecoxib 200 MG capsule Commonly known as:  CELEBREX Take 200 mg by mouth daily. For rheumatoid arthritits   cetirizine 10 MG tablet Commonly known as:  ZYRTEC Take 10 mg by mouth daily. For allergies   clonazePAM 0.5 MG tablet Commonly known as:  KLONOPIN Take 0.25 mg by mouth at bedtime.   finasteride 5 MG tablet Commonly known as:  PROSCAR Take 5 mg by mouth daily.   gemfibrozil 600 MG tablet Commonly known as:  LOPID Take 600 mg by mouth 2  (two) times daily before a meal.   hydrocortisone butyrate 0.1 % Crea cream Commonly known as:  LUCOID Apply 1 application topically daily. To bilateral arms For contact dermatitis   KLOR-CON 10 PO Take 1 tablet by mouth daily. For potassium supplement   magnesium oxide 400 MG tablet Commonly known as:  MAG-OX Take 400 mg by mouth daily.   metFORMIN 500 MG tablet Commonly known as:  GLUCOPHAGE Give 2 and 1/2 tablets (1250 mg)  po twice daily   metoprolol tartrate 25 MG tablet Commonly known as:  LOPRESSOR Take 25 mg by mouth 2 (two) times daily. For HTN   NITROSTAT 0.4 MG SL tablet Generic drug:  nitroGLYCERIN Place 0.4 mg under the tongue every 5 (five) minutes as needed for chest pain.   PERIDEX 0.12 % solution Generic drug:  chlorhexidine Use as directed 15 mLs in the mouth or throat at bedtime.   PLAQUENIL 200 MG tablet Generic drug:  hydroxychloroquine Take 200 mg by mouth 2 (two) times daily. For rheumatoid arthritis   ranitidine 75 MG tablet Commonly known as:  ZANTAC Take 150 mg by mouth 2 (two) times daily.   tamsulosin 0.4 MG Caps capsule Commonly known as:  FLOMAX Take 0.8 mg by mouth at bedtime.   THERA Tabs Give 1 tablet by mouth daily.  multivitains therapeutic   torsemide 5 MG tablet Commonly known as:  DEMADEX Give 1 and 1/2 tablet 7.5 mg po daily for pleural effusion (torsemide)   TOUJEO SOLOSTAR [place] Inject 35 Units into the skin at bedtime.   Vitamin D3 50000 units Caps Take 1 capsule by mouth every 30 (thirty) days. On the 15th of every month       Meds ordered this encounter  Medications  . Insulin Glargine (TOUJEO SOLOSTAR [place])    Sig: Inject 35 Units into the skin at bedtime.    Immunization History  Administered Date(s) Administered  . Influenza Whole 02/19/2013  . Influenza-Unspecified 02/26/2014, 02/10/2015, 02/14/2016  . PPD Test 03/05/2009  . Pneumococcal-Unspecified 03/17/2010    Social History  Substance Use Topics  .  Smoking status: Former Smoker    Packs/day: 0.25    Years: 50.00    Types: Cigarettes    Quit date: 03/19/2013  . Smokeless tobacco: Never Used  . Alcohol use No    Review of Systems  DATA OBTAINED: from patient GENERAL:  no fevers, fatigue, appetite changes SKIN: No itching, rash HEENT: No complaint RESPIRATORY: No cough, wheezing, SOB CARDIAC: No chest pain, palpitations, lower extremity edema  GI: No abdominal pain, No N/V/D or constipation, No heartburn or reflux  GU: No dysuria, frequency or urgency, or incontinence  MUSCULOSKELETAL: No unrelieved bone/joint pain NEUROLOGIC: No headache, dizziness  PSYCHIATRIC: No overt anxiety or sadness  Vitals:   07/06/16 0948  BP: 123/70  Pulse: 76  Resp: 18  Temp: 98.9 F (37.2 C)   Body mass index is 32.08 kg/m. Physical Exam  GENERAL APPEARANCE: Alert, conversant, No acute distress  SKIN: No diaphoresis rash HEENT: Unremarkable RESPIRATORY: Breathing is even, unlabored. Lung sounds are clear   CARDIOVASCULAR: Heart RRR no murmurs, rubs or gallops. No peripheral edema  GASTROINTESTINAL: Abdomen is soft, non-tender, not distended w/ normal bowel sounds.  GENITOURINARY: Bladder non tender, not distended  MUSCULOSKELETAL: No abnormal joints or musculature NEUROLOGIC: Cranial nerves 2-12 grossly intact; stiff BLE, ataxic BUE PSYCHIATRIC: Mood and affect appropriate to situation, no behavioral issues  Patient Active Problem List   Diagnosis Date Noted  . 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 Movement disorder 09/20/2015  . Aspiration pneumonia ([place]) 04/23/2015  . UTI (urinary tract infection) 10/26/2014  . PVD (peripheral vascular disease) ([place]) 10/26/2014  . OA (osteoarthritis) 07/20/2014  . Duodenal ulcer 07/20/2014  . CHF (congestive heart failure) ([place]) 07/20/2014  . Pleural effusion 07/20/2014  . AR (allergic rhinitis) 07/20/2014  . Insulin use (long-term) in type 2 diabetes ([place]) 07/20/2014  . Dysphagia,  oropharyngeal 06/22/2014  . Anemia of chronic disease 02/15/2014  . Hypertensive heart disease with CHF (congestive heart failure) ([place]) 02/15/2014  . PNA (pneumonia) 02/15/2014  . Chronic diarrhea 02/15/2014  . DM (diabetes mellitus), type 2 with peripheral vascular complications ([place]) 10/93/2355  . GERD (gastroesophageal reflux disease)   . Rheumatoid arthritis ([place])   . Hyperlipidemia   . BPH (benign prostatic hyperplasia)   . 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 Pulmonary infiltrates 12/22/2012  . COPD  undetermined GOLD status 12/22/2012  . Smoker 12/22/2012    CMP     Component Value Date/Time   NA 141 06/24/2016   K 4.8 06/24/2016   BUN 15 06/24/2016   CREATININE 0.7 06/24/2016   AST 13 (A) 04/14/2016   ALT 6 (A) 04/14/2016   ALKPHOS 149 (A) 04/14/2016    Recent Labs  01/16/16 04/14/16 06/24/16  NA 138 137 141  K 4.7 5.2 4.8  BUN '20 19 15  '$ CREATININE 1.0 0.7 0.7    Recent Labs  04/14/16  AST 13*  ALT 6*  ALKPHOS 149*    Recent Labs  01/16/16 06/24/16  WBC 3.7 3.8  HGB 13.1* 14.6  HCT 39* 45  PLT  --  330    Recent Labs  11/28/15 06/09/16  CHOL 123 141  LDLCALC 59 81  TRIG 178* 159   Lab Results  Component Value Date   MICROALBUR 1 02/04/2016   Lab Results  Component Value Date   TSH 3.72 04/14/2016   Lab Results  Component Value Date   HGBA1C 8.4 04/14/2016   Lab Results  Component Value Date   CHOL 141 06/09/2016   HDL 28 (A) 06/09/2016   LDLCALC 81 06/09/2016   TRIG 159 06/09/2016    Significant Diagnostic Results in last 30 days:  No results found.  Assessment and Plan  Eaton-Lambert syndrome Progressive but slowly; pt works with restorative, is out of room a lot; will cont supportive care  Hyperlipidemia LDL 81, HDL 28; controlled;cont lopid 600 mg BID  Hypertensive heart disease with CHF (congestive heart failure) Controlled; cont metoprolol 25 mg BID and demadex 7.5 mg daily   Justin Phommachanh D. Sheppard Coil, MD

## 2016-07-13 LAB — HM DIABETES EYE EXAM

## 2016-07-24 ENCOUNTER — Encounter: Payer: Self-pay | Admitting: Internal Medicine

## 2016-07-24 NOTE — Assessment & Plan Note (Signed)
Progressive but slowly; pt works with restorative, is out of room a lot; will cont supportive care

## 2016-07-24 NOTE — Assessment & Plan Note (Signed)
LDL 81, HDL 28; controlled;cont lopid 600 mg BID

## 2016-07-24 NOTE — Assessment & Plan Note (Signed)
Controlled; cont metoprolol 25 mg BID and demadex 7.5 mg daily

## 2016-07-27 ENCOUNTER — Encounter: Payer: Self-pay | Admitting: Internal Medicine

## 2016-07-27 NOTE — Progress Notes (Signed)
Location:  [facility] Room Number: [phone] Place of Service:  SNF [phone])  [name], MD  Patient Care Team: Hennie Duos, MD as PCP - General (Internal Medicine)  Extended Emergency Contact Information Primary Emergency Contact: Osvaldo Shipper Address: [address] [address] [place] Phone: [phone] Relation: Spouse    Allergies: Primaxin [imipenem]  Chief Complaint  Patient presents with  . Acute Visit    HPI: Patient is 71 y.o. male who is being seen acutely because an outbreak of Influenza A per CDC guidelines was recognized on 06/16/2016. Pt has no c/o flu like symptoms;therefore pt will need to be prophylaxed with Tamiflu for a minimum of 14 days per CDC protocol.  Past Medical History:  Diagnosis Date  . BPH (benign prostatic hyperplasia)   . Coronary artery disease   . Diabetes mellitus without complication ([facility])   . 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 GERD (gastroesophageal reflux disease)   . Hyperlipidemia   . Rheumatoid arthritis ([place])     No past surgical history on file.  Allergies as of 06/16/2016      Reactions   Primaxin [imipenem]       Medication List       Accurate as of 06/16/16 11:59 PM. Always use your most recent med list.          acetaminophen 325 MG tablet Commonly known as:  TYLENOL Take 650 mg by mouth every 6 (six) hours as needed for mild pain. 325 mg caplet administer 2 tablets q6h prn for temp greater than 99.5   CALCIUM 500 PO Take 1 tablet by mouth 3 (three) times daily.   celecoxib 200 MG capsule Commonly known as:  CELEBREX Take 200 mg by mouth daily. For rheumatoid arthritits   cetirizine 10 MG tablet Commonly known as:  ZYRTEC Take 10 mg by mouth daily. For allergies   clonazePAM 0.5 MG tablet Commonly known as:  KLONOPIN Take 0.25 mg by mouth at bedtime.   finasteride 5 MG tablet Commonly known  as:  PROSCAR Take 5 mg by mouth daily.   gemfibrozil 600 MG tablet Commonly known as:  LOPID Take 600 mg by mouth 2 (two) times daily before a meal.   hydrocortisone butyrate 0.1 % Crea cream Commonly known as:  LUCOID Apply 1 application topically daily. To bilateral arms For contact dermatitis   KLOR-CON 10 PO Take 1 tablet by mouth daily. For potassium supplement   magnesium oxide 400 MG tablet Commonly known as:  MAG-OX Take 400 mg by mouth daily.   metFORMIN 500 MG tablet Commonly known as:  GLUCOPHAGE Give 2 and 1/2 tablets (1250 mg)  po twice daily   metoprolol tartrate 25 MG tablet Commonly known as:  LOPRESSOR Take 25 mg by mouth 2 (two) times daily. For HTN   NITROSTAT 0.4 MG SL tablet Generic drug:  nitroGLYCERIN Place 0.4 mg under the tongue every 5 (five) minutes as needed for chest pain.   PERIDEX 0.12 % solution Generic drug:  chlorhexidine Use as directed 15 mLs in the mouth or throat at bedtime.   PLAQUENIL 200 MG tablet Generic drug:  hydroxychloroquine Take 200 mg by mouth 2 (two) times daily. For rheumatoid arthritis   ranitidine 75 MG tablet Commonly known as:  ZANTAC Take 150 mg by mouth 2 (two) times daily.   tamsulosin 0.4 MG Caps capsule Commonly  known as:  FLOMAX Take 0.8 mg by mouth at bedtime.   THERA Tabs Give 1 tablet by mouth daily. multivitains therapeutic   torsemide 5 MG tablet Commonly known as:  DEMADEX Give 1 and 1/2 tablet 7.5 mg po daily for pleural effusion (torsemide)   TOUJEO SOLOSTAR [place] Inject 35 Units into the skin at bedtime.   Vitamin D3 50000 units Caps Take 1 capsule by mouth every 30 (thirty) days. On the 15th of every month       No orders of the defined types were placed in this encounter.   Immunization History  Administered Date(s) Administered  . Influenza Whole 02/19/2013  . Influenza-Unspecified 02/26/2014, 02/10/2015, 02/14/2016  . PPD Test 03/05/2009  . Pneumococcal-Unspecified 03/17/2010      Social History  Substance Use Topics  . Smoking status: Former Smoker    Packs/day: 0.25    Years: 50.00    Types: Cigarettes    Quit date: 03/19/2013  . Smokeless tobacco: Never Used  . Alcohol use No    Review of Systems  DATA OBTAINED: from patient, nurse GENERAL:  no fevers SKIN: No itching, rash HEENT: no rhinorrhea, congestion, ST or ear pain RESPIRATORY: No cough, wheezing, SOB CARDIAC: No chest pain, palpitations, lower extremity edema  GI: No abdominal pain, No N/V/D or constipation, No heartburn or reflux  MUSCULOSKELETAL: No muscle aches NEUROLOGIC: No headache, dizziness   Vitals:   06/16/16 1419  BP: 123/70  Pulse: 88  Resp: (!) 22  Temp: 98.9 F (37.2 C)   Body mass index is 32.08 kg/m. Physical Exam  GENERAL APPEARANCE: Alert, conversant, No acute distress  SKIN: No diaphoresis rash HEENT: Unremarkable RESPIRATORY: Breathing is even, unlabored. Lung sounds are clear   CARDIOVASCULAR: Heart RRR no murmurs, rubs or gallops. No peripheral edema  GASTROINTESTINAL: Abdomen is soft, non-tender, not distended w/ normal bowel sounds.   NEUROLOGIC: Cranial nerves 2-12 grossly intact PSYCHIATRIC: baseline, no mental status changes  Patient Active Problem List   Diagnosis Date Noted  . Cerebellar ataxia ([facility]) 02/01/2016  . Movement disorder 09/20/2015  . Aspiration pneumonia ([place]) 04/23/2015  . UTI (urinary tract infection) 10/26/2014  . PVD (peripheral vascular disease) ([place]) 10/26/2014  . OA (osteoarthritis) 07/20/2014  . Duodenal ulcer 07/20/2014  . CHF (congestive heart failure) ([place]) 07/20/2014  . Pleural effusion 07/20/2014  . AR (allergic rhinitis) 07/20/2014  . Insulin use (long-term) in type 2 diabetes ([place]) 07/20/2014  . Dysphagia, oropharyngeal 06/22/2014  . Anemia of chronic disease 02/15/2014  . Hypertensive heart disease with CHF (congestive heart failure) ([place]) 02/15/2014  . PNA (pneumonia) 02/15/2014  . Chronic diarrhea 02/15/2014   . DM (diabetes mellitus), type 2 with peripheral vascular complications ([place]) 38/18/2993  . GERD (gastroesophageal reflux disease)   . Rheumatoid arthritis ([place])   . Hyperlipidemia   . BPH (benign prostatic hyperplasia)   . 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 Pulmonary infiltrates 12/22/2012  . COPD  undetermined GOLD status 12/22/2012  . Smoker 12/22/2012    CMP     Component Value Date/Time   NA 141 06/24/2016   K 4.8 06/24/2016   BUN 15 06/24/2016   CREATININE 0.7 06/24/2016   AST 13 (A) 04/14/2016   ALT 6 (A) 04/14/2016   ALKPHOS 149 (A) 04/14/2016    Recent Labs  01/16/16 04/14/16 06/24/16  NA 138 137 141  K 4.7 5.2 4.8  BUN '20 19 15  '$ CREATININE 1.0 0.7 0.7    Recent Labs  04/14/16  AST 13*  ALT 6*  ALKPHOS 149*    Recent Labs  01/16/16 06/24/16  WBC 3.7 3.8  HGB 13.1* 14.6  HCT 39* 45  PLT  --  330    Recent Labs  11/28/15 06/09/16  CHOL 123 141  LDLCALC 59 81  TRIG 178* 159   Lab Results  Component Value Date   MICROALBUR 1 02/04/2016   Lab Results  Component Value Date   TSH 3.72 04/14/2016   Lab Results  Component Value Date   HGBA1C 8.4 04/14/2016   Lab Results  Component Value Date   CHOL 141 06/09/2016   HDL 28 (A) 06/09/2016   LDLCALC 81 06/09/2016   TRIG 159 06/09/2016    Significant Diagnostic Results in last 30 days:  No results found.  Assessment and Plan  EXPOSURE TO FLU/ INFLUENZA OUTBREAK AT SNF-   CrCl calculated by me-   119     Dose for 14 days-  75 mg daily Pt will be monitored daily for flu like symptoms                                                                                 Webb Silversmith D. Sheppard Coil, MD

## 2016-07-31 LAB — MICROALBUMIN, URINE: Microalb, Ur: 3.5

## 2016-08-02 LAB — HEMOGLOBIN A1C: Hemoglobin A1C: 9.6

## 2016-08-04 ENCOUNTER — Non-Acute Institutional Stay (SKILLED_NURSING_FACILITY): Payer: Medicare Other | Admitting: Internal Medicine

## 2016-08-04 ENCOUNTER — Encounter: Payer: Self-pay | Admitting: Internal Medicine

## 2016-08-04 DIAGNOSIS — M159 Polyosteoarthritis, unspecified: Secondary | ICD-10-CM

## 2016-08-04 DIAGNOSIS — M0579 Rheumatoid arthritis with rheumatoid factor of multiple sites without organ or systems involvement: Secondary | ICD-10-CM

## 2016-08-04 DIAGNOSIS — M150 Primary generalized (osteo)arthritis: Secondary | ICD-10-CM

## 2016-08-04 DIAGNOSIS — G259 Extrapyramidal and movement disorder, unspecified: Secondary | ICD-10-CM

## 2016-08-04 LAB — HM DIABETES FOOT EXAM

## 2016-08-04 NOTE — Progress Notes (Signed)
Location:  [facility] Room Number: [phone] Place of Service:  SNF [phone])  [name], MD  Patient Care Team: Hennie Duos, MD as PCP - General (Internal Medicine)  Extended Emergency Contact Information Primary Emergency Contact: Osvaldo Shipper Address: [address] [address] [place] Phone: [phone] Relation: Spouse    Allergies: Primaxin [imipenem]  Chief Complaint  Patient presents with  . Medical Management of Chronic Issues    Routine Visit    HPI: Patient is 71 y.o. male who is being seen for routine issues of RA, OA and movement disorder.  Past Medical History:  Diagnosis Date  . BPH (benign prostatic hyperplasia)   . Coronary artery disease   . Diabetes mellitus without complication ([place])   . 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 GERD (gastroesophageal reflux disease)   . Hyperlipidemia   . Rheumatoid arthritis ([place])     No past surgical history on file.  Allergies as of 08/04/2016      Reactions   Primaxin [imipenem]       Medication List       Accurate as of 08/04/16 11:59 PM. Always use your most recent med list.          acetaminophen 325 MG tablet Commonly known as:  TYLENOL Take 650 mg by mouth every 6 (six) hours as needed for mild pain. 325 mg caplet administer 2 tablets q6h prn for temp greater than 99.5   CALCIUM 500 PO Take 1 tablet by mouth 3 (three) times daily.   celecoxib 200 MG capsule Commonly known as:  CELEBREX Take 200 mg by mouth daily. For rheumatoid arthritits   cetirizine 10 MG tablet Commonly known as:  ZYRTEC Take 10 mg by mouth daily. For allergies   clonazePAM 0.5 MG tablet Commonly known as:  KLONOPIN Take 0.25 mg by mouth at bedtime.   finasteride 5 MG tablet Commonly known as:  PROSCAR Take 5 mg by mouth daily.   gemfibrozil 600 MG tablet Commonly known as:  LOPID Take 600 mg by mouth 2 (two)  times daily before a meal.   hydrocortisone butyrate 0.1 % Crea cream Commonly known as:  LUCOID Apply 1 application topically daily. To bilateral arms For contact dermatitis   KLOR-CON 10 PO Take 1 tablet by mouth daily. For potassium supplement   magnesium oxide 400 MG tablet Commonly known as:  MAG-OX Take 400 mg by mouth daily.   metFORMIN 500 MG tablet Commonly known as:  GLUCOPHAGE Give 2 and 1/2 tablets (1250 mg)  po twice daily   metoprolol tartrate 25 MG tablet Commonly known as:  LOPRESSOR Take 25 mg by mouth 2 (two) times daily. For HTN   NITROSTAT 0.4 MG SL tablet Generic drug:  nitroGLYCERIN Place 0.4 mg under the tongue every 5 (five) minutes as needed for chest pain.   PERIDEX 0.12 % solution Generic drug:  chlorhexidine Use as directed 15 mLs in the mouth or throat at bedtime.   PLAQUENIL 200 MG tablet Generic drug:  hydroxychloroquine Take 200 mg by mouth 2 (two) times daily. For rheumatoid arthritis   ranitidine 75 MG tablet Commonly known as:  ZANTAC Take 150 mg by mouth 2 (two) times daily.   tamsulosin 0.4 MG Caps capsule Commonly known as:  FLOMAX Take 0.8 mg by mouth at bedtime.   THERA Tabs Give 1 tablet by mouth daily.  multivitains therapeutic   torsemide 5 MG tablet Commonly known as:  DEMADEX Give 1 and 1/2 tablet 7.5 mg po daily for pleural effusion (torsemide)   TOUJEO SOLOSTAR [place] Inject 35 Units into the skin at bedtime.   Vitamin D3 50000 units Caps Take 1 capsule by mouth every 30 (thirty) days. On the 15th of every month       No orders of the defined types were placed in this encounter.   Immunization History  Administered Date(s) Administered  . Influenza Whole 02/19/2013  . Influenza-Unspecified 02/26/2014, 02/10/2015, 02/14/2016  . PPD Test 03/05/2009  . Pneumococcal-Unspecified 03/17/2010    Social History  Substance Use Topics  . Smoking status: Former Smoker    Packs/day: 0.25    Years: 50.00    Types:  Cigarettes    Quit date: 03/19/2013  . Smokeless tobacco: Never Used  . Alcohol use No    Review of Systems  DATA OBTAINED: from patient, nurse GENERAL:  no fevers, fatigue, appetite changes SKIN: No itching, rash HEENT: No complaint RESPIRATORY: No cough, wheezing, SOB CARDIAC: No chest pain, palpitations, lower extremity edema  GI: No abdominal pain, No N/V/D or constipation, No heartburn or reflux  GU: No dysuria, frequency or urgency, or incontinence  MUSCULOSKELETAL: No unrelieved bone/joint pain NEUROLOGIC: No headache, dizziness  PSYCHIATRIC: No overt anxiety or sadness  Vitals:   08/04/16 1004  BP: 124/72  Pulse: 80  Resp: (!) 22  Temp: 98.1 F (36.7 C)   Body mass index is 31.08 kg/m. Physical Exam  GENERAL APPEARANCE: Alert, conversant, No acute distress  SKIN: No diaphoresis rash HEENT: Unremarkable RESPIRATORY: Breathing is even, unlabored. Lung sounds are clear   CARDIOVASCULAR: Heart RRR no murmurs, rubs or gallops. No peripheral edema  GASTROINTESTINAL: Abdomen is soft, non-tender, not distended w/ normal bowel sounds.  GENITOURINARY: Bladder non tender, not distended  MUSCULOSKELETAL: BLE braces, stiffness NEUROLOGIC: Cranial nerves 2-12 grossly intact; BLE with stiffness and braces and BUE with mild-mod ataxia PSYCHIATRIC: Mood and affect appropriate to situation, no behavioral issues  Patient Active Problem List   Diagnosis Date Noted  . 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 Movement disorder 09/20/2015  . Aspiration pneumonia ([place]) 04/23/2015  . UTI (urinary tract infection) 10/26/2014  . PVD (peripheral vascular disease) ([place]) 10/26/2014  . OA (osteoarthritis) 07/20/2014  . Duodenal ulcer 07/20/2014  . CHF (congestive heart failure) ([place]) 07/20/2014  . Pleural effusion 07/20/2014  . AR (allergic rhinitis) 07/20/2014  . Insulin use (long-term) in type 2 diabetes ([place]) 07/20/2014  . Dysphagia, oropharyngeal 06/22/2014  . Anemia of chronic  disease 02/15/2014  . Hypertensive heart disease with CHF (congestive heart failure) ([place]) 02/15/2014  . PNA (pneumonia) 02/15/2014  . Chronic diarrhea 02/15/2014  . DM (diabetes mellitus), type 2 with peripheral vascular complications ([place]) 45/80/9983  . GERD (gastroesophageal reflux disease)   . Rheumatoid arthritis ([place])   . Hyperlipidemia   . BPH (benign prostatic hyperplasia)   . 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 Pulmonary infiltrates 12/22/2012  . COPD  undetermined GOLD status 12/22/2012  . Smoker 12/22/2012    CMP     Component Value Date/Time   NA 141 06/24/2016   K 4.8 06/24/2016   BUN 15 06/24/2016   CREATININE 0.7 06/24/2016   AST 13 (A) 04/14/2016   ALT 6 (A) 04/14/2016   ALKPHOS 149 (A) 04/14/2016    Recent Labs  01/16/16 04/14/16 06/24/16  NA 138 137 141  K 4.7 5.2 4.8  BUN 20 19  15  CREATININE 1.0 0.7 0.7    Recent Labs  04/14/16  AST 13*  ALT 6*  ALKPHOS 149*    Recent Labs  01/16/16 06/24/16  WBC 3.7 3.8  HGB 13.1* 14.6  HCT 39* 45  PLT  --  330    Recent Labs  11/28/15 06/09/16  CHOL 123 141  LDLCALC 59 81  TRIG 178* 159   Lab Results  Component Value Date   MICROALBUR 1 02/04/2016   Lab Results  Component Value Date   TSH 3.72 04/14/2016   Lab Results  Component Value Date   HGBA1C 8.4 04/14/2016   Lab Results  Component Value Date   CHOL 141 06/09/2016   HDL 28 (A) 06/09/2016   LDLCALC 81 06/09/2016   TRIG 159 06/09/2016    Significant Diagnostic Results in last 30 days:  No results found.  Assessment and Plan  Rheumatoid arthritis Symptoms are controlled on plaquenil 200 mg BID and celebrex 200 mg BID; plan to continue current routine  OA (osteoarthritis) Ongoing and chronic in the setting of eaton lambert syndrome and RA; pt uses tylenol and celebrex for pain; he is a [employer]-he does not like to be coddled  Movement disorder Upper extremity ataxia; I have not noted worsening nor has pt had c/o;plan to cont  clonazdepam 0.25 mg qHS     Genea Rheaume D. Sheppard Coil, MD

## 2016-08-05 ENCOUNTER — Encounter: Payer: Self-pay | Admitting: Internal Medicine

## 2016-08-06 ENCOUNTER — Encounter: Payer: Self-pay | Admitting: Internal Medicine

## 2016-08-28 ENCOUNTER — Encounter: Payer: Self-pay | Admitting: Internal Medicine

## 2016-08-28 NOTE — Assessment & Plan Note (Signed)
Ongoing and chronic in the setting of eaton lambert syndrome and RA; pt uses tylenol and celebrex for pain; he is a [employer]-he does not like to be coddled

## 2016-08-28 NOTE — Assessment & Plan Note (Signed)
Upper extremity ataxia; I have not noted worsening nor has pt had c/o;plan to cont clonazdepam 0.25 mg qHS

## 2016-08-28 NOTE — Assessment & Plan Note (Signed)
Symptoms are controlled on plaquenil 200 mg BID and celebrex 200 mg BID; plan to continue current routine

## 2016-09-08 ENCOUNTER — Non-Acute Institutional Stay (SKILLED_NURSING_FACILITY): Payer: Medicare Other | Admitting: Internal Medicine

## 2016-09-08 ENCOUNTER — Encounter: Payer: Self-pay | Admitting: Internal Medicine

## 2016-09-08 DIAGNOSIS — I110 Hypertensive heart disease with heart failure: Secondary | ICD-10-CM | POA: Diagnosis not present

## 2016-09-08 DIAGNOSIS — R1312 Dysphagia, oropharyngeal phase: Secondary | ICD-10-CM | POA: Diagnosis not present

## 2016-09-08 DIAGNOSIS — I5022 Chronic systolic (congestive) heart failure: Secondary | ICD-10-CM

## 2016-09-08 NOTE — Progress Notes (Signed)
Location:   [facility] of Service:   SNF  Hennie Duos, MD  Patient Care Team: Hennie Duos, MD as PCP - General (Internal Medicine)  Extended Emergency Contact Information Primary Emergency Contact: Osvaldo Shipper Address: [place] [address] [place] Phone: [phone] Relation: Spouse    Allergies: Primaxin [imipenem]  Chief Complaint  Patient presents with  . Medical Management of Chronic Issues    Routine Visit    HPI: Patient is 71 y.o. male who is being seen for routine issues of CHF, HTN and oropharyngeal dysphagia.  Past Medical History:  Diagnosis Date  . BPH (benign prostatic hyperplasia)   . Coronary artery disease   . Diabetes mellitus without complication ([place])   . 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 GERD (gastroesophageal reflux disease)   . Hyperlipidemia   . Rheumatoid arthritis ([place])     No past surgical history on file.  Allergies as of 09/08/2016      Reactions   Primaxin [imipenem]       Medication List       Accurate as of 09/08/16 11:59 PM. Always use your most recent med list.          acetaminophen 325 MG tablet Commonly known as:  TYLENOL Take 650 mg by mouth every 6 (six) hours as needed for mild pain. 325 mg caplet administer 2 tablets q6h prn for temp greater than 99.5   CALCIUM 500 PO Take 1 tablet by mouth 3 (three) times daily.   celecoxib 200 MG capsule Commonly known as:  CELEBREX Take 200 mg by mouth daily. For rheumatoid arthritits   cetirizine 10 MG tablet Commonly known as:  ZYRTEC Take 10 mg by mouth daily. For allergies   clonazePAM 0.5 MG tablet Commonly known as:  KLONOPIN Take 0.25 mg by mouth at bedtime.   finasteride 5 MG tablet Commonly known as:  PROSCAR Take 5 mg by mouth daily.   gemfibrozil 600 MG tablet Commonly known as:  LOPID Take 600 mg by mouth 2 (two) times daily before a  meal.   hydrocortisone butyrate 0.1 % Crea cream Commonly known as:  LUCOID Apply 1 application topically daily. To bilateral arms For contact dermatitis   KLOR-CON 10 PO Take 1 tablet by mouth daily. For potassium supplement   magnesium oxide 400 MG tablet Commonly known as:  MAG-OX Take 400 mg by mouth daily.   metFORMIN 500 MG tablet Commonly known as:  GLUCOPHAGE Give 2 and 1/2 tablets (1250 mg)  po twice daily   metoprolol tartrate 25 MG tablet Commonly known as:  LOPRESSOR Take 25 mg by mouth 2 (two) times daily. For HTN   NITROSTAT 0.4 MG SL tablet Generic drug:  nitroGLYCERIN Place 0.4 mg under the tongue every 5 (five) minutes as needed for chest pain.   PERIDEX 0.12 % solution Generic drug:  chlorhexidine Use as directed 15 mLs in the mouth or throat at bedtime.   PLAQUENIL 200 MG tablet Generic drug:  hydroxychloroquine Take 200 mg by mouth 2 (two) times daily. For rheumatoid arthritis   ranitidine 75 MG tablet Commonly known as:  ZANTAC Take 150 mg by mouth 2 (two) times daily.   tamsulosin 0.4 MG Caps capsule Commonly known as:  FLOMAX Take 0.8 mg by mouth at bedtime.   THERA Tabs Give 1 tablet by mouth daily. multivitains  therapeutic   torsemide 5 MG tablet Commonly known as:  DEMADEX Give 1 and 1/2 tablet 7.5 mg po daily for pleural effusion (torsemide)   TOUJEO SOLOSTAR [place] Inject 35 Units into the skin at bedtime.   Vitamin D3 50000 units Caps Take 1 capsule by mouth every 30 (thirty) days. On the 15th of every month       No orders of the defined types were placed in this encounter.   Immunization History  Administered Date(s) Administered  . Influenza Whole 02/19/2013  . Influenza-Unspecified 02/26/2014, 02/10/2015, 02/14/2016  . PPD Test 03/05/2009  . Pneumococcal-Unspecified 03/17/2010    Social History  Substance Use Topics  . Smoking status: Former Smoker    Packs/day: 0.25    Years: 50.00    Types: Cigarettes    Quit  date: 03/19/2013  . Smokeless tobacco: Never Used  . Alcohol use No    Review of Systems  DATA OBTAINED: from patient, nurse GENERAL:  no fevers, fatigue, appetite changes SKIN: No itching, rash HEENT: No complaint RESPIRATORY: No cough, wheezing, SOB CARDIAC: No chest pain, palpitations, lower extremity edema  GI: No abdominal pain, No N/V/D or constipation, No heartburn or reflux  GU: No dysuria, frequency or urgency, or incontinence  MUSCULOSKELETAL: No unrelieved bone/joint pain NEUROLOGIC: No headache, dizziness  PSYCHIATRIC: No overt anxiety or sadness  Vitals:   09/08/16 0926  BP: 115/66  Pulse: 76  Temp: 97.3 F (36.3 C)   Body mass index is 31.57 kg/m. Physical Exam  GENERAL APPEARANCE: Alert, conversant, No acute distress  SKIN: No diaphoresis rash HEENT: Unremarkable RESPIRATORY: Breathing is even, unlabored. Lung sounds are clear   CARDIOVASCULAR: Heart RRR no murmurs, rubs or gallops. No peripheral edema  GASTROINTESTINAL: Abdomen is soft, non-tender, not distended w/ normal bowel sounds.  GENITOURINARY: Bladder non tender, not distended  MUSCULOSKELETAL: BLE braces with stiffness NEUROLOGIC: Cranial nerves 2-12 grossly intact; BLE with braces , stiffness and weakness, BUE with mild tp mod ataxia PSYCHIATRIC: Mood and affect appropriate to situation, no behavioral issues  Patient Active Problem List   Diagnosis Date Noted  . 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 Movement disorder 09/20/2015  . Aspiration pneumonia ([place]) 04/23/2015  . UTI (urinary tract infection) 10/26/2014  . PVD (peripheral vascular disease) ([place]) 10/26/2014  . OA (osteoarthritis) 07/20/2014  . Duodenal ulcer 07/20/2014  . CHF (congestive heart failure) ([place]) 07/20/2014  . Pleural effusion 07/20/2014  . AR (allergic rhinitis) 07/20/2014  . Insulin use (long-term) in type 2 diabetes ([place]) 07/20/2014  . Dysphagia, oropharyngeal 06/22/2014  . Anemia of chronic disease 02/15/2014  .  Hypertensive heart disease with CHF (congestive heart failure) ([place]) 02/15/2014  . PNA (pneumonia) 02/15/2014  . Chronic diarrhea 02/15/2014  . DM (diabetes mellitus), type 2 with peripheral vascular complications ([place]) 09/32/6712  . GERD (gastroesophageal reflux disease)   . Rheumatoid arthritis ([place])   . Hyperlipidemia   . BPH (benign prostatic hyperplasia)   . 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 Pulmonary infiltrates 12/22/2012  . COPD  undetermined GOLD status 12/22/2012  . Smoker 12/22/2012    CMP     Component Value Date/Time   NA 141 06/24/2016   K 4.8 06/24/2016   BUN 15 06/24/2016   CREATININE 0.7 06/24/2016   AST 13 (A) 04/14/2016   ALT 6 (A) 04/14/2016   ALKPHOS 149 (A) 04/14/2016    Recent Labs  01/16/16 04/14/16 06/24/16  NA 138 137 141  K 4.7 5.2 4.8  BUN 20 19 15  CREATININE 1.0 0.7 0.7    Recent Labs  04/14/16  AST 13*  ALT 6*  ALKPHOS 149*    Recent Labs  01/16/16 06/24/16 09/14/16  WBC 3.7 3.8 5.8  HGB 13.1* 14.6 14.0  HCT 39* 45 43  PLT  --  330 323    Recent Labs  11/28/15 06/09/16  CHOL 123 141  LDLCALC 59 81  TRIG 178* 159   Lab Results  Component Value Date   MICROALBUR 3.5 07/31/2016   Lab Results  Component Value Date   TSH 3.72 04/14/2016   Lab Results  Component Value Date   HGBA1C 10.4 09/20/2016   Lab Results  Component Value Date   CHOL 141 06/09/2016   HDL 28 (A) 06/09/2016   LDLCALC 81 06/09/2016   TRIG 159 06/09/2016    Significant Diagnostic Results in last 30 days:  No results found.  Assessment and Plan  CHF (congestive heart failure) Chronic and stable; cont demedex 7.5 mg daily and metoprolol 25 mg BID  Hypertensive heart disease with CHF (congestive heart failure) Controlled;cont demadex 7.5 mg daily and metoprolol 25 mg BID  Dysphagia, oropharyngeal No aspirations ina while;pt has waiver so drinks thin liquids; will cont to monitor and support     Webb Silversmith D. Sheppard Coil, MD

## 2016-09-14 LAB — CBC AND DIFFERENTIAL
HCT: 43 % (ref 41–53)
Hemoglobin: 14 g/dL (ref 13.5–17.5)
Platelets: 323 10*3/uL (ref 150–399)
WBC: 5.8 10^3/mL

## 2016-09-20 LAB — HEMOGLOBIN A1C: Hemoglobin A1C: 10.4

## 2016-09-21 ENCOUNTER — Non-Acute Institutional Stay (SKILLED_NURSING_FACILITY): Payer: Medicare Other | Admitting: Internal Medicine

## 2016-09-21 ENCOUNTER — Encounter: Payer: Self-pay | Admitting: Internal Medicine

## 2016-09-21 DIAGNOSIS — Z794 Long term (current) use of insulin: Secondary | ICD-10-CM | POA: Diagnosis not present

## 2016-09-21 DIAGNOSIS — E1165 Type 2 diabetes mellitus with hyperglycemia: Secondary | ICD-10-CM | POA: Diagnosis not present

## 2016-09-21 NOTE — Progress Notes (Signed)
Location:  [facility] Room Number: [phone] Place of Service:  SNF (31)  Hennie Duos, MD  Patient Care Team: Hennie Duos, MD as PCP - General (Internal Medicine)  Extended Emergency Contact Information Primary Emergency Contact: Osvaldo Shipper Address: [place] [address] [place] Phone: [phone] Relation: Spouse    Allergies: Primaxin [imipenem]  Chief Complaint  Patient presents with  . Acute Visit    Acute     HPI: Patient is 71 y.o. male who is being seen today because his HbA1c is 10.4. According to nursing pt will not allow but one shot a day and 1 BS check daily. I am with him today to try to talk him into more intervention.  Past Medical History:  Diagnosis Date  . BPH (benign prostatic hyperplasia)   . Coronary artery disease   . Diabetes mellitus without complication ([place])   . 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 GERD (gastroesophageal reflux disease)   . Hyperlipidemia   . Rheumatoid arthritis ([place])     No past surgical history on file.  Allergies as of 09/21/2016      Reactions   Primaxin [imipenem]       Medication List       Accurate as of 09/21/16  3:29 PM. Always use your most recent med list.          acetaminophen 325 MG tablet Commonly known as:  TYLENOL Take 650 mg by mouth every 6 (six) hours as needed for mild pain. 325 mg caplet administer 2 tablets q6h prn for temp greater than 99.5   CALCIUM 500 PO Take 1 tablet by mouth 3 (three) times daily.   celecoxib 200 MG capsule Commonly known as:  CELEBREX Take 200 mg by mouth daily. For rheumatoid arthritits   cetirizine 10 MG tablet Commonly known as:  ZYRTEC Take 10 mg by mouth daily. For allergies   clonazePAM 0.5 MG tablet Commonly known as:  KLONOPIN Take 0.25 mg by mouth at bedtime.   finasteride 5 MG tablet Commonly known as:  PROSCAR Take 5 mg by mouth  daily.   gemfibrozil 600 MG tablet Commonly known as:  LOPID Take 600 mg by mouth 2 (two) times daily before a meal.   hydrocortisone butyrate 0.1 % Crea cream Commonly known as:  LUCOID Apply 1 application topically daily. To bilateral arms For contact dermatitis   KLOR-CON 10 PO Take 1 tablet by mouth daily. For potassium supplement   magnesium oxide 400 MG tablet Commonly known as:  MAG-OX Take 400 mg by mouth daily.   metFORMIN 500 MG tablet Commonly known as:  GLUCOPHAGE Give 2 and 1/2 tablets (1250 mg)  po twice daily   metoprolol tartrate 25 MG tablet Commonly known as:  LOPRESSOR Take 25 mg by mouth 2 (two) times daily. For HTN   NITROSTAT 0.4 MG SL tablet Generic drug:  nitroGLYCERIN Place 0.4 mg under the tongue every 5 (five) minutes as needed for chest pain.   PERIDEX 0.12 % solution Generic drug:  chlorhexidine Use as directed 15 mLs in the mouth or throat at bedtime.   PLAQUENIL 200 MG tablet Generic drug:  hydroxychloroquine Take 200 mg by mouth 2 (two) times daily. For rheumatoid arthritis   ranitidine 75 MG tablet Commonly known as:  ZANTAC Take 150 mg by mouth 2 (two) times daily.   tamsulosin  0.4 MG Caps capsule Commonly known as:  FLOMAX Take 0.8 mg by mouth at bedtime.   THERA Tabs Give 1 tablet by mouth daily. multivitains therapeutic   torsemide 5 MG tablet Commonly known as:  DEMADEX Give 1 and 1/2 tablet 7.5 mg po daily for pleural effusion (torsemide)   TOUJEO SOLOSTAR [place] Inject 35 Units into the skin at bedtime.   Vitamin D3 50000 units Caps Take 1 capsule by mouth every 30 (thirty) days. On the 15th of every month       No orders of the defined types were placed in this encounter.   Immunization History  Administered Date(s) Administered  . Influenza Whole 02/19/2013  . Influenza-Unspecified 02/26/2014, 02/10/2015, 02/14/2016  . PPD Test 03/05/2009  . Pneumococcal-Unspecified 03/17/2010    Social History  Substance  Use Topics  . Smoking status: Former Smoker    Packs/day: 0.25    Years: 50.00    Types: Cigarettes    Quit date: 03/19/2013  . Smokeless tobacco: Never Used  . Alcohol use No    Review of Systems  DATA OBTAINED: from patient GENERAL:  no fevers, fatigue, appetite changes SKIN: No itching, rash HEENT: No complaint RESPIRATORY: No cough, wheezing, SOB CARDIAC: No chest pain, palpitations, lower extremity edema  GI: No abdominal pain, No N/V/D or constipation, No heartburn or reflux  GU: No dysuria, frequency or urgency, or incontinence  MUSCULOSKELETAL: No unrelieved bone/joint pain NEUROLOGIC: No headache, dizziness  PSYCHIATRIC: No overt anxiety or sadness  Vitals:   09/21/16 1527  BP: 130/80  Pulse: 74  Resp: 20  Temp: 98 F (36.7 C)   Body mass index is 31.57 kg/m. Physical Exam  GENERAL APPEARANCE: Alert, conversant, No acute distress  SKIN: No diaphoresis rash HEENT: Unremarkable RESPIRATORY: Breathing is even, unlabored. Lung sounds are clear   CARDIOVASCULAR: Heart RRR no murmurs, rubs or gallops. No peripheral edema  GASTROINTESTINAL: Abdomen is soft, non-tender, not distended w/ normal bowel sounds.  GENITOURINARY: Bladder non tender, not distended  MUSCULOSKELETAL: No abnormal joints or musculature NEUROLOGIC: Cranial nerves 2-12 grossly intact; stiff , poorly moving BLE, ataxia BUE PSYCHIATRIC: Mood and affect appropriate to situation, no behavioral issues  Patient Active Problem List   Diagnosis Date Noted  . 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 Movement disorder 09/20/2015  . Aspiration pneumonia ([place]) 04/23/2015  . UTI (urinary tract infection) 10/26/2014  . PVD (peripheral vascular disease) ([place]) 10/26/2014  . OA (osteoarthritis) 07/20/2014  . Duodenal ulcer 07/20/2014  . CHF (congestive heart failure) ([place]) 07/20/2014  . Pleural effusion 07/20/2014  . AR (allergic rhinitis) 07/20/2014  . Insulin use (long-term) in type 2 diabetes ([place])  07/20/2014  . Dysphagia, oropharyngeal 06/22/2014  . Anemia of chronic disease 02/15/2014  . Hypertensive heart disease with CHF (congestive heart failure) ([place]) 02/15/2014  . PNA (pneumonia) 02/15/2014  . Chronic diarrhea 02/15/2014  . DM (diabetes mellitus), type 2 with peripheral vascular complications ([place]) 50/01/3817  . GERD (gastroesophageal reflux disease)   . Rheumatoid arthritis ([place])   . Hyperlipidemia   . BPH (benign prostatic hyperplasia)   . 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 Pulmonary infiltrates 12/22/2012  . COPD  undetermined GOLD status 12/22/2012  . Smoker 12/22/2012    CMP     Component Value Date/Time   NA 141 06/24/2016   K 4.8 06/24/2016   BUN 15 06/24/2016   CREATININE 0.7 06/24/2016   AST 13 (A) 04/14/2016   ALT 6 (A) 04/14/2016   ALKPHOS 149 (A) 04/14/2016  Recent Labs  01/16/16 04/14/16 06/24/16  NA 138 137 141  K 4.7 5.2 4.8  BUN '20 19 15  '$ CREATININE 1.0 0.7 0.7    Recent Labs  04/14/16  AST 13*  ALT 6*  ALKPHOS 149*    Recent Labs  01/16/16 06/24/16 09/14/16  WBC 3.7 3.8 5.8  HGB 13.1* 14.6 14.0  HCT 39* 45 43  PLT  --  330 323    Recent Labs  11/28/15 06/09/16  CHOL 123 141  LDLCALC 59 81  TRIG 178* 159   Lab Results  Component Value Date   MICROALBUR 3.5 07/31/2016   Lab Results  Component Value Date   TSH 3.72 04/14/2016   Lab Results  Component Value Date   HGBA1C 10.4 09/20/2016   Lab Results  Component Value Date   CHOL 141 06/09/2016   HDL 28 (A) 06/09/2016   LDLCALC 81 06/09/2016   TRIG 159 06/09/2016    Significant Diagnostic Results in last 30 days:  No results found.  Assessment and Plan  DM2 with HYPERGLYCEMIA - I spoke with pt at length about his DM, what high BS means , etc. It took some slick talking because pt is very stubborn. He said he would allow twice daily BS check for a week and he would accept 2 shots a day. Plan to monitor am and dinner BS for a week then work out a  regimen.   Time spent > 25 min Daylon Lafavor D. Sheppard Coil, MD

## 2016-10-05 ENCOUNTER — Encounter: Payer: Self-pay | Admitting: Internal Medicine

## 2016-10-05 ENCOUNTER — Non-Acute Institutional Stay (SKILLED_NURSING_FACILITY): Payer: Medicare Other | Admitting: Internal Medicine

## 2016-10-05 DIAGNOSIS — M0579 Rheumatoid arthritis with rheumatoid factor of multiple sites without organ or systems involvement: Secondary | ICD-10-CM | POA: Diagnosis not present

## 2016-10-05 DIAGNOSIS — G119 Hereditary ataxia, unspecified: Secondary | ICD-10-CM

## 2016-10-05 DIAGNOSIS — G7080 Lambert-Eaton syndrome, unspecified: Secondary | ICD-10-CM

## 2016-10-05 NOTE — Progress Notes (Signed)
Location:  [facility] Room Number: [phone] Place of Service:  SNF (31)  Hennie Duos, MD  Patient Care Team: Hennie Duos, MD as PCP - General (Internal Medicine)  Extended Emergency Contact Information Primary Emergency Contact: Osvaldo Shipper Address: [place] [address] [place] Phone: [phone] Relation: Spouse    Allergies: Primaxin [imipenem]  Chief Complaint  Patient presents with  . Medical Management of Chronic Issues    Routine Visit     HPI: Patient is 71 y.o. male who Is being seen for routine issues of rheumatoid arthritis, cerebellar ataxia, and [name] syndrome.  Past Medical History:  Diagnosis Date  . BPH (benign prostatic hyperplasia)   . Coronary artery disease   . Diabetes mellitus without complication ([place])   . 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 GERD (gastroesophageal reflux disease)   . Heart failure, unspecified ([place])   . Hyperlipidemia   . 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 Rheumatoid arthritis ([place])     No past surgical history on file.  Allergies as of 10/05/2016      Reactions   Primaxin [imipenem]       Medication List       Accurate as of 10/05/16 11:59 PM. Always use your most recent med list.          acetaminophen 325 MG tablet Commonly known as:  TYLENOL Take 650 mg by mouth every 6 (six) hours as needed for mild pain. 325 mg caplet administer 2 tablets q6h prn for temp greater than 99.5   CALCIUM 500 PO Take 1 tablet by mouth 3 (three) times daily.   celecoxib 200 MG capsule Commonly known as:  CELEBREX Take 200 mg by mouth daily. For rheumatoid arthritits   cetirizine 10 MG tablet Commonly known as:  ZYRTEC Take 10 mg by mouth daily. For allergies   clonazePAM 0.5 MG tablet Commonly known as:  KLONOPIN Take 0.25 mg by mouth at bedtime.   finasteride 5 MG tablet Commonly known as:   PROSCAR Take 5 mg by mouth daily.   gemfibrozil 600 MG tablet Commonly known as:  LOPID Take 600 mg by mouth 2 (two) times daily before a meal.   hydrocortisone butyrate 0.1 % Crea cream Commonly known as:  LUCOID Apply 1 application topically daily. To bilateral arms For contact dermatitis   KLOR-CON 10 PO Take 1 tablet by mouth daily. For potassium supplement   magnesium oxide 400 MG tablet Commonly known as:  MAG-OX Take 400 mg by mouth daily.   metFORMIN 500 MG tablet Commonly known as:  GLUCOPHAGE Give 2 and 1/2 tablets (1250 mg)  po twice daily   metoprolol tartrate 25 MG tablet Commonly known as:  LOPRESSOR Take 25 mg by mouth 2 (two) times daily. For HTN   NITROSTAT 0.4 MG SL tablet Generic drug:  nitroGLYCERIN Place 0.4 mg under the tongue every 5 (five) minutes as needed for chest pain.   PERIDEX 0.12 % solution Generic drug:  chlorhexidine Use as directed 15 mLs in the mouth or throat at bedtime.   PLAQUENIL 200 MG tablet Generic drug:  hydroxychloroquine Take 200 mg by mouth 2 (two) times daily. For rheumatoid arthritis   ranitidine 75 MG tablet Commonly known as:  ZANTAC Take 150 mg by mouth 2 (two) times daily.   tamsulosin 0.4 MG Caps capsule Commonly known as:  FLOMAX Take 0.8 mg by mouth at bedtime.   THERA Tabs Give 1 tablet by mouth daily. multivitains therapeutic   torsemide 5 MG tablet Commonly known as:  DEMADEX Give 1 and 1/2 tablet 7.5 mg po daily for pleural effusion (torsemide)   TOUJEO SOLOSTAR [place] Inject 35 Units into the skin at bedtime.   Vitamin D3 50000 units Caps Take 1 capsule by mouth every 30 (thirty) days. On the 15th of every month       No orders of the defined types were placed in this encounter.   Immunization History  Administered Date(s) Administered  . Influenza Whole 02/19/2013  . Influenza-Unspecified 02/26/2014, 02/10/2015, 02/14/2016  . PPD Test 03/05/2009  . Pneumococcal-Unspecified 03/17/2010     Social History  Substance Use Topics  . Smoking status: Former Smoker    Packs/day: 0.25    Years: 40.00    Types: Cigarettes    Quit date: 03/19/2013  . Smokeless tobacco: Never Used  . Alcohol use No    Review of Systems  DATA OBTAINED: from patient GENERAL:  no fevers, fatigue, appetite changes SKIN: No itching, rash HEENT: No complaint RESPIRATORY: No cough, wheezing, SOB CARDIAC: No chest pain, palpitations, lower extremity edema  GI: No abdominal pain, No N/V/D or constipation, No heartburn or reflux  GU: No dysuria, frequency or urgency, or incontinence  MUSCULOSKELETAL: No unrelieved bone/joint pain NEUROLOGIC: No headache, dizziness  PSYCHIATRIC: No overt anxiety or sadness  Vitals:   10/05/16 0957  BP: 139/75  Pulse: 74  Resp: 16  Temp: 97.7 F (36.5 C)   Body mass index is 31.16 kg/m. Physical Exam  GENERAL APPEARANCE: Alert, conversant, No acute distress  SKIN: No diaphoresis rash HEENT: Unremarkable RESPIRATORY: Breathing is even, unlabored. Lung sounds are clear   CARDIOVASCULAR: Heart RRR no murmurs, rubs or gallops. No peripheral edema  GASTROINTESTINAL: Abdomen is soft, non-tender, not distended w/ normal bowel sounds.  GENITOURINARY: Bladder non tender, not distended  MUSCULOSKELETAL: No abnormal joints or musculature NEUROLOGIC: Cranial nerves 2-12 grossly intact. Moves all extremities; BLE very stiff with dec movement; BUE with ataxia PSYCHIATRIC: Mood and affect appropriate to situation, no behavioral issues  Patient Active Problem List   Diagnosis Date Noted  . Cerebellar ataxia ([place]) 02/01/2016  . Movement disorder 09/20/2015  . Aspiration pneumonia ([place]) 04/23/2015  . UTI (urinary tract infection) 10/26/2014  . PVD (peripheral vascular disease) ([place]) 10/26/2014  . OA (osteoarthritis) 07/20/2014  . Duodenal ulcer 07/20/2014  . CHF (congestive heart failure) ([place]) 07/20/2014  . Pleural effusion 07/20/2014  . AR (allergic rhinitis)  07/20/2014  . Insulin use (long-term) in type 2 diabetes ([place]) 07/20/2014  . Dysphagia, oropharyngeal 06/22/2014  . Anemia of chronic disease 02/15/2014  . Hypertensive heart disease with CHF (congestive heart failure) ([place]) 02/15/2014  . PNA (pneumonia) 02/15/2014  . Chronic diarrhea 02/15/2014  . DM (diabetes mellitus), type 2 with peripheral vascular complications ([place]) 25/95/6387  . GERD (gastroesophageal reflux disease)   . Rheumatoid arthritis ([place])   . Hyperlipidemia   . BPH (benign prostatic hyperplasia)   . 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 Pulmonary infiltrates 12/22/2012  . COPD  undetermined GOLD status 12/22/2012  . Smoker 12/22/2012    CMP     Component Value Date/Time   NA 141 06/24/2016   K 4.8 06/24/2016   BUN 15 06/24/2016   CREATININE 0.7 06/24/2016   AST 13 (A) 04/14/2016   ALT 6 (A) 04/14/2016   ALKPHOS 149 (A) 04/14/2016  Recent Labs  01/16/16 04/14/16 06/24/16  NA 138 137 141  K 4.7 5.2 4.8  BUN '20 19 15  '$ CREATININE 1.0 0.7 0.7    Recent Labs  04/14/16  AST 13*  ALT 6*  ALKPHOS 149*    Recent Labs  01/16/16 06/24/16 09/14/16  WBC 3.7 3.8 5.8  HGB 13.1* 14.6 14.0  HCT 39* 45 43  PLT  --  330 323    Recent Labs  11/28/15 06/09/16  CHOL 123 141  LDLCALC 59 81  TRIG 178* 159   Lab Results  Component Value Date   MICROALBUR 3.5 07/31/2016   Lab Results  Component Value Date   TSH 3.72 04/14/2016   Lab Results  Component Value Date   HGBA1C 10.4 09/20/2016   Lab Results  Component Value Date   CHOL 141 06/09/2016   HDL 28 (A) 06/09/2016   LDLCALC 81 06/09/2016   TRIG 159 06/09/2016    Significant Diagnostic Results in last 30 days:  No results found.  Assessment and Plan  Rheumatoid arthritis Chronic and stable; continue Plaquenil 200 mg twice a day and Celebrex 200 mg twice a day  Cerebellar ataxia (HCC) Chronic and fairly stable; continue clonazepam 0.25 mg daily at bedtime-patient will not allow [place] to give  him anything stronger.  Eaton-Lambert syndrome Chronic and slowly progressive; patient worse with his restorative therapy; patient is spending a little more time in his room and he used to; will continue supportive care     Webb Silversmith D. Sheppard Coil, MD

## 2016-10-10 ENCOUNTER — Encounter: Payer: Self-pay | Admitting: Internal Medicine

## 2016-10-10 NOTE — Assessment & Plan Note (Signed)
Chronic and stable; cont demedex 7.5 mg daily and metoprolol 25 mg BID

## 2016-10-10 NOTE — Assessment & Plan Note (Signed)
No aspirations ina while;pt has waiver so drinks thin liquids; will cont to monitor and support

## 2016-10-10 NOTE — Assessment & Plan Note (Signed)
Controlled;cont demadex 7.5 mg daily and metoprolol 25 mg BID

## 2016-10-29 ENCOUNTER — Non-Acute Institutional Stay (SKILLED_NURSING_FACILITY): Payer: Medicare Other

## 2016-10-29 DIAGNOSIS — Z0000 Encounter for general adult medical examination without abnormal findings: Secondary | ICD-10-CM | POA: Diagnosis not present

## 2016-10-29 NOTE — Progress Notes (Signed)
Subjective:   Justin Clements is a 71 y.o. male who presents for an Initial Medicare Annual Wellness Visit  at [facility]   Objective:    Today's Vitals   10/29/16 1253  BP: 130/60  Pulse: 70  Temp: 97.7 F (36.5 C)  TempSrc: Oral  SpO2: 94%  Weight: 217 lb (98.4 kg)  Height: 5\' 10"  (1.778 m)   Body mass index is 31.14 kg/m.  Current Medications (verified) Outpatient Encounter Prescriptions as of 10/29/2016  Medication Sig  . acetaminophen (TYLENOL) 325 MG tablet Take 650 mg by mouth every 6 (six) hours as needed for mild pain. 325 mg caplet administer 2 tablets q6h prn for temp greater than 99.5  . Calcium-Magnesium-Vitamin D (CALCIUM 500 PO) Take 1 tablet by mouth 3 (three) times daily.   . celecoxib (CELEBREX) 200 MG capsule Take 200 mg by mouth daily. For rheumatoid arthritits  . cetirizine (ZYRTEC) 10 MG tablet Take 10 mg by mouth daily. For allergies  . chlorhexidine (PERIDEX) 0.12 % solution Use as directed 15 mLs in the mouth or throat at bedtime.   . Cholecalciferol (VITAMIN D3) 50000 units CAPS Take 1 capsule by mouth every 30 (thirty) days. On the 15th of every month  . clonazePAM (KLONOPIN) 0.5 MG tablet Take 0.25 mg by mouth at bedtime.   . finasteride (PROSCAR) 5 MG tablet Take 5 mg by mouth daily.  Marland Kitchen gemfibrozil (LOPID) 600 MG tablet Take 600 mg by mouth 2 (two) times daily before a meal.  . hydrocortisone butyrate (LUCOID) 0.1 % CREA cream Apply 1 application topically daily. To bilateral arms For contact dermatitis  . hydroxychloroquine (PLAQUENIL) 200 MG tablet Take 200 mg by mouth 2 (two) times daily. For rheumatoid arthritis  . Insulin Glargine (TOUJEO SOLOSTAR [place]) Inject 35 Units into the skin at bedtime.  . magnesium oxide (MAG-OX) 400 MG tablet Take 400 mg by mouth daily.  . metFORMIN (GLUCOPHAGE) 500 MG tablet Give 2 and 1/2 tablets (1250 mg)  po twice daily  . metoprolol tartrate (LOPRESSOR) 25 MG tablet Take 25 mg by mouth 2 (two)  times daily. For HTN  . Multiple Vitamin (THERA) TABS Give 1 tablet by mouth daily. multivitains therapeutic   . nitroGLYCERIN (NITROSTAT) 0.4 MG SL tablet Place 0.4 mg under the tongue every 5 (five) minutes as needed for chest pain.  Marland Kitchen Potassium Chloride (KLOR-CON 10 PO) Take 1 tablet by mouth daily. For potassium supplement  . ranitidine (ZANTAC) 75 MG tablet Take 150 mg by mouth 2 (two) times daily.  . tamsulosin (FLOMAX) 0.4 MG CAPS capsule Take 0.8 mg by mouth at bedtime.   . torsemide (DEMADEX) 5 MG tablet Give 1 and 1/2 tablet 7.5 mg po daily for pleural effusion (torsemide)   No facility-administered encounter medications on file as of 10/29/2016.     Allergies (verified) Primaxin [imipenem]   History: Past Medical History:  Diagnosis Date  . BPH (benign prostatic hyperplasia)   . Coronary artery disease   . Diabetes mellitus without complication ([place])   . 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 GERD (gastroesophageal reflux disease)   . Hyperlipidemia   . Rheumatoid arthritis ([place])    History reviewed. No pertinent surgical history. History reviewed. No pertinent family history. Social History   Occupational History  . Not on file.   Social History Main Topics  . Smoking status: Former Smoker    Packs/day: 0.25    Years: 40.00  Types: Cigarettes    Quit date: 03/19/2013  . Smokeless tobacco: Never Used  . Alcohol use No  . Drug use: No  . Sexual activity: Not on file   Tobacco Counseling Counseling given: Not Answered   Activities of Daily Living In your present state of health, do you have any difficulty performing the following activities: 10/29/2016  Hearing? N  Vision? N  Difficulty concentrating or making decisions? N  Walking or climbing stairs? Y  Dressing or bathing? Y  Doing errands, shopping? Y  Preparing Food and eating ? Y  Using the Toilet? Y  In the past six months, have you accidently leaked urine? N  Do you  have problems with loss of bowel control? N  Managing your Medications? Y  Managing your Finances? Y  Housekeeping or managing your Housekeeping? Y  Some recent data might be hidden    Immunizations and Health Maintenance Immunization History  Administered Date(s) Administered  . Influenza Whole 02/19/2013  . Influenza-Unspecified 02/26/2014, 02/10/2015, 02/14/2016  . PPD Test 03/05/2009  . Pneumococcal-Unspecified 03/17/2010   Health Maintenance Due  Topic Date Due  . COLONOSCOPY  04/12/1995    Patient Care Team: Hennie Duos, MD as PCP - General (Internal Medicine)  Indicate any recent Medical Services you may have received from other than [facility] providers in the past year (date may be approximate).    Assessment:   This is a routine wellness examination for Justin Clements.   Hearing/Vision screen No exam data present  Dietary issues and exercise activities discussed: Current Exercise Habits: Home exercise routine, Type of exercise: walking, Time (Minutes): 15, Frequency (Times/Week): 7, Weekly Exercise (Minutes/Week): 105, Intensity: Mild, Exercise limited by: orthopedic condition(s)  Goals    None     Depression Screen PHQ 2/9 Scores 10/29/2016  PHQ - 2 Score 0    Fall Risk Fall Risk  10/29/2016  Falls in the past year? Yes  Number falls in past yr: 1  Injury with Fall? No    Cognitive Function:     6CIT Screen 10/29/2016  What Year? 0 points  What month? 0 points  What time? 0 points  Count back from 20 0 points  Months in reverse 0 points  Repeat phrase 0 points  Total Score 0    Screening Tests Health Maintenance  Topic Date Due  . COLONOSCOPY  04/12/1995  . TETANUS/TDAP  05/18/2023 (Originally 04/11/1965)  . Hepatitis C Screening  05/18/2023 (Originally Jul 23, 1945)  . PNA vac Low Risk Adult (1 of 2 - PCV13) 05/18/2023 (Originally 03/18/2011)  . INFLUENZA VACCINE  12/15/2016  . HEMOGLOBIN A1C  03/23/2017  . OPHTHALMOLOGY EXAM  07/13/2017  . URINE  MICROALBUMIN  07/31/2017  . FOOT EXAM  08/04/2017        Plan:    I have personally reviewed and addressed the Medicare Annual Wellness questionnaire and have noted the following in the patient's chart:  A. Medical and social history B. Use of alcohol, tobacco or illicit drugs  C. Current medications and supplements D. Functional ability and status E.  Nutritional status F.  Physical activity G. Advance directives H. List of other physicians I.  Hospitalizations, surgeries, and ER visits in previous 12 months J.  [place] to include hearing, vision, cognitive, depression L. Referrals and appointments - none  In addition, I have reviewed and discussed with patient certain preventive protocols, quality metrics, and best practice recommendations. A written personalized care plan for preventive services as well as general  preventive health recommendations were provided to patient.  See attached scanned questionnaire for additional information.   Signed,   Rich Reining, RN Nurse Health Advisor   Quick Notes   Health Maintenance: TDAP, colonoscopy, Hep C screen due     Abnormal Screen: None     Patient Concerns: None     Nurse Concerns: None

## 2016-10-29 NOTE — Patient Instructions (Signed)
Justin Clements , Thank you for taking time to come for your Medicare Wellness Visit. I appreciate your ongoing commitment to your health goals. Please review the following plan we discussed and let me know if I can assist you in the future.   Screening recommendations/referrals: Colonoscopy due Recommended yearly ophthalmology/optometry visit for glaucoma screening and checkup Recommended yearly dental visit for hygiene and checkup  Vaccinations: Influenza vaccine up to date. Due 02/13/2017 Pneumococcal vaccine up to date Tdap vaccine due Shingles vaccine not in records  Advanced directives: DNR in chart  Conditions/risks identified: None  Next appointment: Dr. Sheppard Coil makes monthly rounds  Preventive Care 65 Years and Older, Male Preventive care refers to lifestyle choices and visits with your health care provider that can promote health and wellness. What does preventive care include?  A yearly physical exam. This is also called an annual well check.  Dental exams once or twice a year.  Routine eye exams. Ask your health care provider how often you should have your eyes checked.  Personal lifestyle choices, including:  Daily care of your teeth and gums.  Regular physical activity.  Eating a healthy diet.  Avoiding tobacco and drug use.  Limiting alcohol use.  Practicing safe sex.  Taking low doses of aspirin every day.  Taking vitamin and mineral supplements as recommended by your health care provider. What happens during an annual well check? The services and screenings done by your health care provider during your annual well check will depend on your age, overall health, lifestyle risk factors, and family history of disease. Counseling  Your health care provider may ask you questions about your:  Alcohol use.  Tobacco use.  Drug use.  Emotional well-being.  Home and relationship well-being.  Sexual activity.  Eating habits.  History of  falls.  Memory and ability to understand (cognition).  Work and work Statistician. Screening  You may have the following tests or measurements:  Height, weight, and BMI.  Blood pressure.  Lipid and cholesterol levels. These may be checked every 5 years, or more frequently if you are over 44 years old.  Skin check.  Lung cancer screening. You may have this screening every year starting at age 74 if you have a 30-pack-year history of smoking and currently smoke or have quit within the past 15 years.  Fecal occult blood test (FOBT) of the stool. You may have this test every year starting at age 52.  Flexible sigmoidoscopy or colonoscopy. You may have a sigmoidoscopy every 5 years or a colonoscopy every 10 years starting at age 32.  Prostate cancer screening. Recommendations will vary depending on your family history and other risks.  Hepatitis C blood test.  Hepatitis B blood test.  Sexually transmitted disease (STD) testing.  Diabetes screening. This is done by checking your blood sugar (glucose) after you have not eaten for a while (fasting). You may have this done every 1-3 years.  Abdominal aortic aneurysm (AAA) screening. You may need this if you are a current or former smoker.  Osteoporosis. You may be screened starting at age 64 if you are at high risk. Talk with your health care provider about your test results, treatment options, and if necessary, the need for more tests. Vaccines  Your health care provider may recommend certain vaccines, such as:  Influenza vaccine. This is recommended every year.  Tetanus, diphtheria, and acellular pertussis (Tdap, Td) vaccine. You may need a Td booster every 10 years.  Zoster vaccine. You may need  this after age 51.  Pneumococcal 13-valent conjugate (PCV13) vaccine. One dose is recommended after age 56.  Pneumococcal polysaccharide (PPSV23) vaccine. One dose is recommended after age 30. Talk to your health care provider about  which screenings and vaccines you need and how often you need them. This information is not intended to replace advice given to you by your health care provider. Make sure you discuss any questions you have with your health care provider. Document Released: 05/30/2015 Document Revised: 01/21/2016 Document Reviewed: 03/04/2015 Elsevier Interactive Patient Education  2017 [employer] Prevention in the Home Falls can cause injuries. They can happen to people of all ages. There are many things you can do to make your home safe and to help prevent falls. What can I do on the outside of my home?  Regularly fix the edges of walkways and driveways and fix any cracks.  Remove anything that might make you trip as you walk through a door, such as a raised step or threshold.  Trim any bushes or trees on the path to your home.  Use bright outdoor lighting.  Clear any walking paths of anything that might make someone trip, such as rocks or tools.  Regularly check to see if handrails are loose or broken. Make sure that both sides of any steps have handrails.  Any raised decks and porches should have guardrails on the edges.  Have any leaves, snow, or ice cleared regularly.  Use sand or salt on walking paths during winter.  Clean up any spills in your garage right away. This includes oil or grease spills. What can I do in the bathroom?  Use night lights.  Install grab bars by the toilet and in the tub and shower. Do not use towel bars as grab bars.  Use non-skid mats or decals in the tub or shower.  If you need to sit down in the shower, use a plastic, non-slip stool.  Keep the floor dry. Clean up any water that spills on the floor as soon as it happens.  Remove soap buildup in the tub or shower regularly.  Attach bath mats securely with double-sided non-slip rug tape.  Do not have throw rugs and other things on the floor that can make you trip. What can I do in the  bedroom?  Use night lights.  Make sure that you have a light by your bed that is easy to reach.  Do not use any sheets or blankets that are too big for your bed. They should not hang down onto the floor.  Have a firm chair that has side arms. You can use this for support while you get dressed.  Do not have throw rugs and other things on the floor that can make you trip. What can I do in the kitchen?  Clean up any spills right away.  Avoid walking on wet floors.  Keep items that you use a lot in easy-to-reach places.  If you need to reach something above you, use a strong step stool that has a grab bar.  Keep electrical cords out of the way.  Do not use floor polish or wax that makes floors slippery. If you must use wax, use non-skid floor wax.  Do not have throw rugs and other things on the floor that can make you trip. What can I do with my stairs?  Do not leave any items on the stairs.  Make sure that there are handrails on both sides of  the stairs and use them. Fix handrails that are broken or loose. Make sure that handrails are as long as the stairways.  Check any carpeting to make sure that it is firmly attached to the stairs. Fix any carpet that is loose or worn.  Avoid having throw rugs at the top or bottom of the stairs. If you do have throw rugs, attach them to the floor with carpet tape.  Make sure that you have a light switch at the top of the stairs and the bottom of the stairs. If you do not have them, ask someone to add them for you. What else can I do to help prevent falls?  Wear shoes that:  Do not have high heels.  Have rubber bottoms.  Are comfortable and fit you well.  Are closed at the toe. Do not wear sandals.  If you use a stepladder:  Make sure that it is fully opened. Do not climb a closed stepladder.  Make sure that both sides of the stepladder are locked into place.  Ask someone to hold it for you, if possible.  Clearly mark and make  sure that you can see:  Any grab bars or handrails.  First and last steps.  Where the edge of each step is.  Use tools that help you move around (mobility aids) if they are needed. These include:  Canes.  Walkers.  Scooters.  Crutches.  Turn on the lights when you go into a dark area. Replace any light bulbs as soon as they burn out.  Set up your furniture so you have a clear path. Avoid moving your furniture around.  If any of your floors are uneven, fix them.  If there are any pets around you, be aware of where they are.  Review your medicines with your doctor. Some medicines can make you feel dizzy. This can increase your chance of falling. Ask your doctor what other things that you can do to help prevent falls. This information is not intended to replace advice given to you by your health care provider. Make sure you discuss any questions you have with your health care provider. Document Released: 02/27/2009 Document Revised: 10/09/2015 Document Reviewed: 06/07/2014 Elsevier Interactive Patient Education  2017 [employer].

## 2016-11-03 ENCOUNTER — Encounter: Payer: Self-pay | Admitting: Internal Medicine

## 2016-11-03 ENCOUNTER — Non-Acute Institutional Stay (SKILLED_NURSING_FACILITY): Payer: Medicare Other | Admitting: Internal Medicine

## 2016-11-03 DIAGNOSIS — J3089 Other allergic rhinitis: Secondary | ICD-10-CM

## 2016-11-03 DIAGNOSIS — K219 Gastro-esophageal reflux disease without esophagitis: Secondary | ICD-10-CM | POA: Diagnosis not present

## 2016-11-03 DIAGNOSIS — N400 Enlarged prostate without lower urinary tract symptoms: Secondary | ICD-10-CM | POA: Diagnosis not present

## 2016-11-03 NOTE — Progress Notes (Signed)
Location:  [facility] Room Number: [phone] Place of Service:  SNF (31)  Hennie Duos, MD  Patient Care Team: Hennie Duos, MD as PCP - General (Internal Medicine)  Extended Emergency Contact Information Primary Emergency Contact: Osvaldo Shipper Address: [place] [address] [place] Phone: [phone] Relation: Spouse    Allergies: Primaxin [imipenem]  Chief Complaint  Patient presents with  . Medical Management of Chronic Issues    routine visit    HPI: Patient is 71 y.o. male with Parkinson's disease and [name] syndrome who is being seen for routine issues of allergic rhinitis, GERD, and BPH.  Past Medical History:  Diagnosis Date  . BPH (benign prostatic hyperplasia)   . Coronary artery disease   . Diabetes mellitus without complication ([place])   . 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 GERD (gastroesophageal reflux disease)   . Heart failure, unspecified ([place])   . Hyperlipidemia   . 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 Rheumatoid arthritis ([place])     No past surgical history on file.  Allergies as of 11/03/2016      Reactions   Primaxin [imipenem]       Medication List       Accurate as of 11/03/16 11:59 PM. Always use your most recent med list.          acetaminophen 325 MG tablet Commonly known as:  TYLENOL Take 650 mg by mouth every 6 (six) hours as needed for mild pain. 325 mg caplet administer 2 tablets q6h prn for temp greater than 99.5   CALCIUM 500 PO Take 1 tablet by mouth 3 (three) times daily.   celecoxib 200 MG capsule Commonly known as:  CELEBREX Take 200 mg by mouth daily. For rheumatoid arthritits   cetirizine 10 MG tablet Commonly known as:  ZYRTEC Take 10 mg by mouth daily. For allergies   clonazePAM 0.5 MG tablet Commonly known as:  KLONOPIN Take 0.25 mg by mouth at bedtime.   finasteride 5 MG tablet Commonly known  as:  PROSCAR Take 5 mg by mouth daily.   gemfibrozil 600 MG tablet Commonly known as:  LOPID Take 600 mg by mouth 2 (two) times daily before a meal.   hydrocortisone butyrate 0.1 % Crea cream Commonly known as:  LUCOID Apply 1 application topically daily. To bilateral arms For contact dermatitis   KLOR-CON 10 PO Take 1 tablet by mouth daily. For potassium supplement   magnesium oxide 400 MG tablet Commonly known as:  MAG-OX Take 400 mg by mouth daily.   metFORMIN 500 MG tablet Commonly known as:  GLUCOPHAGE Give 2 and 1/2 tablets (1250 mg)  po twice daily   metoprolol tartrate 25 MG tablet Commonly known as:  LOPRESSOR Take 25 mg by mouth 2 (two) times daily. For HTN   NITROSTAT 0.4 MG SL tablet Generic drug:  nitroGLYCERIN Place 0.4 mg under the tongue every 5 (five) minutes as needed for chest pain.   PERIDEX 0.12 % solution Generic drug:  chlorhexidine Use as directed 15 mLs in the mouth or throat at bedtime.   PLAQUENIL 200 MG tablet Generic drug:  hydroxychloroquine Take 200 mg by mouth 2 (two) times daily. For rheumatoid arthritis   ranitidine 75 MG tablet Commonly known as:  ZANTAC Take 150 mg by mouth 2 (two) times daily.   tamsulosin 0.4 MG Caps capsule Commonly  known as:  FLOMAX Take 0.8 mg by mouth at bedtime.   THERA Tabs Give 1 tablet by mouth daily. multivitains therapeutic   torsemide 5 MG tablet Commonly known as:  DEMADEX Give 1 and 1/2 tablet 7.5 mg po daily for pleural effusion (torsemide)   TOUJEO SOLOSTAR [place] Inject 35 Units into the skin at bedtime.   Vitamin D3 50000 units Caps Take 1 capsule by mouth every 30 (thirty) days. On the 15th of every month       No orders of the defined types were placed in this encounter.   Immunization History  Administered Date(s) Administered  . Influenza Whole 02/19/2013  . Influenza-Unspecified 02/26/2014, 02/10/2015, 02/14/2016  . PPD Test 03/05/2009  . Pneumococcal-Unspecified 03/17/2010      Social History  Substance Use Topics  . Smoking status: Former Smoker    Packs/day: 0.25    Years: 40.00    Types: Cigarettes    Quit date: 03/19/2013  . Smokeless tobacco: Never Used  . Alcohol use No    Review of Systems  DATA OBTAINED: from patient, nurse GENERAL:  no fevers, fatigue, appetite changes SKIN: No itching, rash HEENT: No complaint RESPIRATORY: No cough, wheezing, SOB CARDIAC: No chest pain, palpitations, lower extremity edema  GI: No abdominal pain, No N/V/D or constipation, No heartburn or reflux  GU: No dysuria, frequency or urgency, or incontinence  MUSCULOSKELETAL: No unrelieved bone/joint pain NEUROLOGIC: No headache, dizziness  PSYCHIATRIC: No overt anxiety or sadness  Vitals:   11/03/16 1156  BP: 135/80  Pulse: 75  Resp: 16  Temp: 97.7 F (36.5 C)   Body mass index is 30.39 kg/m. Physical Exam  GENERAL APPEARANCE: Alert, conversant, No acute distress  SKIN: No diaphoresis rash HEENT: Unremarkable RESPIRATORY: Breathing is even, unlabored. Lung sounds are clear   CARDIOVASCULAR: Heart RRR no murmurs, rubs or gallops. No peripheral edema  GASTROINTESTINAL: Abdomen is soft, non-tender, not distended w/ normal bowel sounds.  GENITOURINARY: Bladder non tender, not distended  MUSCULOSKELETAL: Braces bilateral lower extremities; lower extremities very stiff NEUROLOGIC: Cranial nerves 2-12 grossly intact. Moves all extremities with stiffness to bilateral lower extremities and ataxia to bilateral upper extremities PSYCHIATRIC: Mood and affect appropriate to situation, no behavioral issues  Patient Active Problem List   Diagnosis Date Noted  . 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 Movement disorder 09/20/2015  . Aspiration pneumonia ([place]) 04/23/2015  . UTI (urinary tract infection) 10/26/2014  . PVD (peripheral vascular disease) ([place]) 10/26/2014  . OA (osteoarthritis) 07/20/2014  . Duodenal ulcer 07/20/2014  . CHF (congestive heart failure)  ([place]) 07/20/2014  . Pleural effusion 07/20/2014  . AR (allergic rhinitis) 07/20/2014  . Insulin use (long-term) in type 2 diabetes ([place]) 07/20/2014  . Dysphagia, oropharyngeal 06/22/2014  . Anemia of chronic disease 02/15/2014  . Hypertensive heart disease with CHF (congestive heart failure) ([place]) 02/15/2014  . PNA (pneumonia) 02/15/2014  . Chronic diarrhea 02/15/2014  . DM (diabetes mellitus), type 2 with peripheral vascular complications ([place]) 18/29/9371  . GERD (gastroesophageal reflux disease)   . Rheumatoid arthritis ([place])   . Hyperlipidemia   . BPH (benign prostatic hyperplasia)   . 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)   . Pulmonary infiltrates 12/22/2012  . COPD  undetermined GOLD status 12/22/2012  . Smoker 12/22/2012    CMP     Component Value Date/Time   NA 141 06/24/2016   K 4.8 06/24/2016   BUN 15 06/24/2016   CREATININE 0.7 06/24/2016   AST 13 (A) 04/14/2016   ALT 6 (  A) 04/14/2016   ALKPHOS 149 (A) 04/14/2016    Recent Labs  01/16/16 04/14/16 06/24/16  NA 138 137 141  K 4.7 5.2 4.8  BUN 20 19 15   CREATININE 1.0 0.7 0.7    Recent Labs  04/14/16  AST 13*  ALT 6*  ALKPHOS 149*    Recent Labs  01/16/16 06/24/16 09/14/16  WBC 3.7 3.8 5.8  HGB 13.1* 14.6 14.0  HCT 39* 45 43  PLT  --  330 323    Recent Labs  06/09/16  CHOL 141  LDLCALC 81  TRIG 159   Lab Results  Component Value Date   MICROALBUR 3.5 07/31/2016   Lab Results  Component Value Date   TSH 3.72 04/14/2016   Lab Results  Component Value Date   HGBA1C 10.4 09/20/2016   Lab Results  Component Value Date   CHOL 141 06/09/2016   HDL 28 (A) 06/09/2016   LDLCALC 81 06/09/2016   TRIG 159 06/09/2016    Significant Diagnostic Results in last 30 days:  No results found.  Assessment and Plan  AR (allergic rhinitis) Chronic and stable; continue 0 take 10 mg by mouth daily for chronic sinusitis per ENT  GERD (gastroesophageal reflux disease) No recent aspiration or reflux  reported; continue Zantac 150 mg by mouth twice a day  BPH (benign prostatic hyperplasia) No recent reflux infections reported; plan to continue Flomax 0.4 mg 2 by mouth daily and Proscar 5 mg 1 by mouth daily    Hennie Duos, MD

## 2016-11-25 NOTE — Assessment & Plan Note (Signed)
Chronic and stable; continue Plaquenil 200 mg twice a day and Celebrex 200 mg twice a day

## 2016-11-25 NOTE — Assessment & Plan Note (Signed)
Chronic and fairly stable; continue clonazepam 0.25 mg daily at bedtime-patient will not allow [place] to give him anything stronger.

## 2016-11-25 NOTE — Assessment & Plan Note (Signed)
Chronic and slowly progressive; patient worse with his restorative therapy; patient is spending a little more time in his room and he used to; will continue supportive care

## 2016-12-02 ENCOUNTER — Encounter: Payer: Self-pay | Admitting: Internal Medicine

## 2016-12-02 NOTE — Assessment & Plan Note (Signed)
No recent aspiration or reflux reported; continue Zantac 150 mg by mouth twice a day

## 2016-12-02 NOTE — Assessment & Plan Note (Signed)
Chronic and stable; continue 0 take 10 mg by mouth daily for chronic sinusitis per ENT

## 2016-12-02 NOTE — Assessment & Plan Note (Signed)
No recent reflux infections reported; plan to continue Flomax 0.4 mg 2 by mouth daily and Proscar 5 mg 1 by mouth daily

## 2016-12-07 ENCOUNTER — Encounter: Payer: Self-pay | Admitting: Internal Medicine

## 2016-12-07 ENCOUNTER — Non-Acute Institutional Stay (SKILLED_NURSING_FACILITY): Payer: Medicare Other | Admitting: Internal Medicine

## 2016-12-07 DIAGNOSIS — I110 Hypertensive heart disease with heart failure: Secondary | ICD-10-CM

## 2016-12-07 DIAGNOSIS — I5022 Chronic systolic (congestive) heart failure: Secondary | ICD-10-CM

## 2016-12-07 DIAGNOSIS — R1312 Dysphagia, oropharyngeal phase: Secondary | ICD-10-CM | POA: Diagnosis not present

## 2016-12-07 NOTE — Progress Notes (Signed)
Location:  [facility] Room Number: [phone] Place of Service:  SNF (31)  Hennie Duos, MD  Patient Care Team: Hennie Duos, MD as PCP - General (Internal Medicine)  Extended Emergency Contact Information Primary Emergency Contact: Osvaldo Shipper Address: [address] [address] [place] Phone: [phone] Relation: Spouse    Allergies: Primaxin [imipenem]  Chief Complaint  Patient presents with  . Medical Management of Chronic Issues    routine visit    HPI: Patient is 71 y.o. male who Is being seen for routine issues of congestive heart failure, hypertension, and dysphasia.  Past Medical History:  Diagnosis Date  . Anemia of chronic disease 02/15/2014  . Aspiration pneumonia ([place]) 04/23/2015  . BPH (benign prostatic hyperplasia)   . 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 Chronic diarrhea 02/15/2014  . COPD  undetermined GOLD status 12/22/2012   Followed in Pulmonary clinic/ [facility]/ [facility] - rec trial off acei 12/22/2012  - Spirometry 02/05/2013 > uninterpretable F/V but doe not appear to have severe obst   . Coronary artery disease   . Diabetes mellitus without complication ([place])   . DM (diabetes mellitus), type 2 with peripheral vascular complications ([place]) 70/01/6282  . Dysphagia, oropharyngeal 06/22/2014  . 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 GERD (gastroesophageal reflux disease)   . Heart failure, unspecified ([place])   . Hyperlipidemia   . Hypertensive heart disease with CHF (congestive heart failure) ([place]) 02/15/2014  . 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 OA (osteoarthritis) 07/20/2014  . Other insomnia   . Pulmonary infiltrates 12/22/2012   Followed in Pulmonary clinic/ [facility]/ [place]  - CT chest 02/13/13  Mass in  R Lung base adj to lateral chest wall and pleural thickening > rec IR bx - PET    03/08/2013 >  Assoc with small effusion > 03/21/2013 CT bx >  pus MODERATE MICROAEROPHILIC STREPTOCOCCI > rx augmentin x 10 d  completed 04/07/13 > improved 04/28/13       . PVD (peripheral vascular disease) ([place]) 10/26/2014  . Rheumatoid arthritis ([place])   . Sleep apnea, unspecified   . Vitamin D deficiency, unspecified     History reviewed. No pertinent surgical history.  Allergies as of 12/07/2016      Reactions   Primaxin [imipenem]       Medication List       Accurate as of 12/07/16 11:59 PM. Always use your most recent med list.          acetaminophen 325 MG tablet Commonly known as:  TYLENOL Take 650 mg by mouth every 6 (six) hours as needed for mild pain. 325 mg caplet administer 2 tablets q6h prn for temp greater than 99.5   CALCIUM 500 PO Take 1 tablet by mouth 3 (three) times daily.   celecoxib 200 MG capsule Commonly known as:  CELEBREX Take 200 mg by mouth daily. For rheumatoid arthritits   cetirizine 10 MG tablet Commonly known as:  ZYRTEC Take 10 mg by mouth daily. For allergies   clonazePAM 0.5 MG tablet Commonly known as:  KLONOPIN Take 0.25 mg by mouth at bedtime.   finasteride 5 MG tablet Commonly known as:  PROSCAR Take 5 mg by mouth daily.   gemfibrozil 600 MG tablet Commonly known as:  LOPID Take 600 mg by mouth 2 (two) times daily before a meal.   hydrocortisone butyrate 0.1 %  Crea cream Commonly known as:  LUCOID Apply 1 application topically daily. To bilateral arms For contact dermatitis   KLOR-CON 10 PO Take 1 tablet by mouth daily. For potassium supplement   magnesium oxide 400 MG tablet Commonly known as:  MAG-OX Take 400 mg by mouth daily.   metFORMIN 500 MG tablet Commonly known as:  GLUCOPHAGE Give 2 and 1/2 tablets (1250 mg)  po twice daily   metoprolol tartrate 25 MG tablet Commonly known as:  LOPRESSOR Take 25 mg by mouth 2 (two) times daily. For HTN   NITROSTAT 0.4 MG SL tablet Generic drug:  nitroGLYCERIN Place 0.4 mg under the tongue every 5 (five) minutes as needed for  chest pain.   PERIDEX 0.12 % solution Generic drug:  chlorhexidine Use as directed 15 mLs in the mouth or throat at bedtime.   PLAQUENIL 200 MG tablet Generic drug:  hydroxychloroquine Take 200 mg by mouth 2 (two) times daily. For rheumatoid arthritis   ranitidine 75 MG tablet Commonly known as:  ZANTAC Take 150 mg by mouth 2 (two) times daily.   tamsulosin 0.4 MG Caps capsule Commonly known as:  FLOMAX Take 0.8 mg by mouth at bedtime.   THERA Tabs Give 1 tablet by mouth daily. multivitains therapeutic   torsemide 5 MG tablet Commonly known as:  DEMADEX Give 1 and 1/2 tablet 7.5 mg po daily for pleural effusion (torsemide)   TOUJEO SOLOSTAR [place] Inject 45 Units into the skin at bedtime.   Vitamin D3 50000 units Caps Take 1 capsule by mouth every 30 (thirty) days. On the 15th of every month       No orders of the defined types were placed in this encounter.   Immunization History  Administered Date(s) Administered  . Influenza Whole 02/19/2013  . Influenza-Unspecified 02/26/2014, 02/10/2015, 02/14/2016  . PPD Test 03/05/2009  . Pneumococcal-Unspecified 03/17/2010    Social History  Substance Use Topics  . Smoking status: Former Smoker    Packs/day: 0.25    Years: 40.00    Types: Cigarettes    Quit date: 03/19/2013  . Smokeless tobacco: Never Used  . Alcohol use No    Review of Systems  DATA OBTAINED: from patient, nurse GENERAL:  no fevers, fatigue, appetite changes SKIN: No itching, rash HEENT: No complaint RESPIRATORY: No cough, wheezing, SOB CARDIAC: No chest pain, palpitations, lower extremity edema  GI: No abdominal pain, No N/V/D or constipation, No heartburn or reflux  GU: No dysuria, frequency or urgency, or incontinence  MUSCULOSKELETAL: No unrelieved bone/joint pain NEUROLOGIC: No headache, dizziness  PSYCHIATRIC: No overt anxiety or sadness  Vitals:   12/07/16 1213  BP: 139/75  Pulse: 78  Resp: (!) 22  Temp: 97.7 F (36.5 C)   Body  mass index is 30.39 kg/m. Physical Exam  GENERAL APPEARANCE: Alert, conversant, No acute distress  SKIN: No diaphoresis rash HEENT: Unremarkable RESPIRATORY: Breathing is even, unlabored. Lung sounds are clear   CARDIOVASCULAR: Heart RRR no murmurs, rubs or gallops. No peripheral edema  GASTROINTESTINAL: Abdomen is soft, non-tender, not distended w/ normal bowel sounds.  GENITOURINARY: Bladder non tender, not distended  MUSCULOSKELETAL: No abnormal joints or musculature NEUROLOGIC: Cranial nerves 2-12 grossly intact; stiffness and decreased movement in bilateral lower extremities, upper extremity ataxia PSYCHIATRIC: Mood and affect appropriate to situation, no behavioral issues  Patient Active Problem List   Diagnosis Date Noted  . 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 Movement disorder 09/20/2015  . Aspiration pneumonia ([place]) 04/23/2015  . UTI (urinary  tract infection) 10/26/2014  . PVD (peripheral vascular disease) ([place]) 10/26/2014  . OA (osteoarthritis) 07/20/2014  . Duodenal ulcer 07/20/2014  . CHF (congestive heart failure) ([place]) 07/20/2014  . Pleural effusion 07/20/2014  . AR (allergic rhinitis) 07/20/2014  . Insulin use (long-term) in type 2 diabetes ([place]) 07/20/2014  . Dysphagia, oropharyngeal 06/22/2014  . Anemia of chronic disease 02/15/2014  . Hypertensive heart disease with CHF (congestive heart failure) ([place]) 02/15/2014  . PNA (pneumonia) 02/15/2014  . Chronic diarrhea 02/15/2014  . DM (diabetes mellitus), type 2 with peripheral vascular complications ([place]) 01/77/9390  . GERD (gastroesophageal reflux disease)   . Rheumatoid arthritis ([place])   . Hyperlipidemia   . BPH (benign prostatic hyperplasia)   . 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 Pulmonary infiltrates 12/22/2012  . COPD  undetermined GOLD status 12/22/2012  . Smoker 12/22/2012    CMP     Component Value Date/Time   NA 139 12/22/2016   K 4.3 12/22/2016   BUN 19 12/22/2016   CREATININE 0.6 12/22/2016    AST 13 (A) 04/14/2016   ALT 6 (A) 04/14/2016   ALKPHOS 149 (A) 04/14/2016    Recent Labs  04/14/16 06/24/16 12/22/16  NA 137 141 139  K 5.2 4.8 4.3  BUN 19 15 19   CREATININE 0.7 0.7 0.6    Recent Labs  04/14/16  AST 13*  ALT 6*  ALKPHOS 149*    Recent Labs  06/24/16 09/14/16 12/22/16  WBC 3.8 5.8 4.0  HGB 14.6 14.0 13.7  HCT 45 43 41  PLT 330 323 337    Recent Labs  06/09/16  CHOL 141  LDLCALC 81  TRIG 159   Lab Results  Component Value Date   MICROALBUR 3.5 07/31/2016   Lab Results  Component Value Date   TSH 3.72 04/14/2016   Lab Results  Component Value Date   HGBA1C 10.4 09/20/2016   Lab Results  Component Value Date   CHOL 141 06/09/2016   HDL 28 (A) 06/09/2016   LDLCALC 81 06/09/2016   TRIG 159 06/09/2016    Significant Diagnostic Results in last 30 days:  No results found.  Assessment and Plan  CHF (congestive heart failure) Chronic and stable no exacerbations; continue Demadex 7.5 mg by mouth daily and metoprolol 25 mg by mouth twice a day  Hypertensive heart disease with CHF (congestive heart failure) Stable and controlled; plan to continue metoprolol 25 mg by mouth twice a day and Demadex 7.5 mg by mouth daily  Dysphagia, oropharyngeal Known aspirations or recent infections; patient has a dysphagia waiver so he drinks thin liquids patient is doing well; we'll continue to monitor    Sara Selvidge D. Sheppard Coil, MD

## 2016-12-22 LAB — CBC AND DIFFERENTIAL
HEMATOCRIT: 41 (ref 41–53)
HEMOGLOBIN: 13.7 (ref 13.5–17.5)
Platelets: 337 (ref 150–399)
WBC: 4

## 2016-12-22 LAB — BASIC METABOLIC PANEL
BUN: 19 (ref 4–21)
Creatinine: 0.6 (ref 0.6–1.3)
GLUCOSE: 131
Potassium: 4.3 (ref 3.4–5.3)
SODIUM: 139 (ref 137–147)

## 2016-12-24 ENCOUNTER — Other Ambulatory Visit: Payer: Self-pay

## 2016-12-29 ENCOUNTER — Non-Acute Institutional Stay (SKILLED_NURSING_FACILITY): Payer: Medicare Other | Admitting: Internal Medicine

## 2016-12-29 ENCOUNTER — Encounter: Payer: Self-pay | Admitting: Internal Medicine

## 2016-12-29 DIAGNOSIS — G259 Extrapyramidal and movement disorder, unspecified: Secondary | ICD-10-CM

## 2016-12-29 DIAGNOSIS — E1151 Type 2 diabetes mellitus with diabetic peripheral angiopathy without gangrene: Secondary | ICD-10-CM | POA: Diagnosis not present

## 2016-12-29 DIAGNOSIS — M0579 Rheumatoid arthritis with rheumatoid factor of multiple sites without organ or systems involvement: Secondary | ICD-10-CM | POA: Diagnosis not present

## 2016-12-29 NOTE — Progress Notes (Signed)
Location:  [facility] Room Number: Ramer of Service:  SNF [phone])  Hennie Duos, MD  Patient Care Team: Hennie Duos, MD as PCP - General (Internal Medicine)  Extended Emergency Contact Information Primary Emergency Contact: Osvaldo Shipper Address: [address] [address] [place] Phone: [phone] Relation: Spouse    Allergies: Primaxin [imipenem]  Chief Complaint  Patient presents with  . Medical Management of Chronic Issues    routine visit    HPI: Patient is 71 y.o. male who Is being seen for routine issues of diabetes mellitus type 2, movement disorder, and rheumatoid arthritis.  Past Medical History:  Diagnosis Date  . Anemia of chronic disease 02/15/2014  . Aspiration pneumonia ([place]) 04/23/2015  . BPH (benign prostatic hyperplasia)   . 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 Chronic diarrhea 02/15/2014  . COPD  undetermined GOLD status 12/22/2012   Followed [facility] Pulmonary clinic/  [facility] - rec trial off acei 12/22/2012  - Spirometry 02/05/2013 > uninterpretable F/V but doe not appear to have severe obst   . Coronary artery disease   . Diabetes mellitus without complication ([place])   . DM (diabetes mellitus), type 2 with peripheral vascular complications ([place]) 51/12/8414  . Dysphagia, oropharyngeal 06/22/2014  . 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 GERD (gastroesophageal reflux disease)   . Heart failure, unspecified ([place])   . Hyperlipidemia   . Hypertensive heart disease with CHF (congestive heart failure) ([place]) 02/15/2014  . Myoneural disorder ([place])   . OA (osteoarthritis) 07/20/2014  . Other insomnia   . Pulmonary infiltrates 12/22/2012   Followed in Pulmonary clinic/  [facility]  - CT chest 02/13/13  Mass in  R Lung base adj to lateral chest wall and pleural thickening > rec IR bx - PET    03/08/2013 >  Assoc with small effusion >  03/21/2013 CT bx > pus MODERATE MICROAEROPHILIC STREPTOCOCCI > rx augmentin x 10 d  completed 04/07/13 > improved 04/28/13       . PVD (peripheral vascular disease) ([place]) 10/26/2014  . Rheumatoid arthritis ([place])   . Sleep apnea, unspecified   . Vitamin D deficiency, unspecified     Past Surgical History:  Procedure Laterality Date  . INTERTROCHANTERIC HIP FRACTURE SURGERY Left 02/08/2004   open reduction and internal fixation utilizing [employer] intertrochanteric nail Dr. Paralee Cancel     Allergies as of 12/29/2016      Reactions   Primaxin [imipenem]       Medication List       Accurate as of 12/29/16 11:59 PM. Always use your most recent med list.          acetaminophen 325 MG tablet Commonly known as:  TYLENOL Take 650 mg by mouth every 6 (six) hours as needed for mild pain. 325 mg caplet administer 2 tablets q6h prn for temp greater than 99.5   CALCIUM 500 PO Take 1 tablet by mouth 3 (three) times daily.   celecoxib 200 MG capsule Commonly known as:  CELEBREX Take 200 mg by mouth daily. For rheumatoid arthritits   cetirizine 10 MG tablet Commonly known as:  ZYRTEC Take 10 mg by mouth daily. For allergies   clonazePAM 0.5 MG tablet Commonly known as:  KLONOPIN Take 0.25 mg by mouth at bedtime.   finasteride 5 MG tablet Commonly known as:  PROSCAR Take 5 mg by mouth  daily.   gemfibrozil 600 MG tablet Commonly known as:  LOPID Take 600 mg by mouth 2 (two) times daily before a meal.   hydrocortisone butyrate 0.1 % Crea cream Commonly known as:  LUCOID Apply 1 application topically daily. To bilateral arms For contact dermatitis   KLOR-CON 10 PO Take 1 tablet by mouth daily. For potassium supplement   magnesium oxide 400 MG tablet Commonly known as:  MAG-OX Take 400 mg by mouth daily.   metFORMIN 500 MG tablet Commonly known as:  GLUCOPHAGE Give 2 and 1/2 tablets (1250 mg)  po twice daily   metoprolol tartrate 25 MG tablet Commonly known as:   LOPRESSOR Take 25 mg by mouth 2 (two) times daily. For HTN   NITROSTAT 0.4 MG SL tablet Generic drug:  nitroGLYCERIN Place 0.4 mg under the tongue every 5 (five) minutes as needed for chest pain.   PERIDEX 0.12 % solution Generic drug:  chlorhexidine Use as directed 15 mLs in the mouth or throat at bedtime.   PLAQUENIL 200 MG tablet Generic drug:  hydroxychloroquine Take 200 mg by mouth 2 (two) times daily. For rheumatoid arthritis   ranitidine 75 MG tablet Commonly known as:  ZANTAC Take 150 mg by mouth 2 (two) times daily.   tamsulosin 0.4 MG Caps capsule Commonly known as:  FLOMAX Take 0.8 mg by mouth at bedtime.   THERA Tabs Give 1 tablet by mouth daily. multivitains therapeutic   torsemide 5 MG tablet Commonly known as:  DEMADEX Give 1 and 1/2 tablet 7.5 mg po daily for pleural effusion (torsemide)   TOUJEO SOLOSTAR [place] Inject 45 Units into the skin at bedtime.   Vitamin D3 50000 units Caps Take 1 capsule by mouth every 30 (thirty) days. On the 15th of every month       No orders of the defined types were placed in this encounter.   Immunization History  Administered Date(s) Administered  . Influenza Whole 02/19/2013  . Influenza-Unspecified 02/26/2014, 02/10/2015, 02/14/2016  . PPD Test 03/05/2009  . Pneumococcal-Unspecified 03/17/2010    Social History  Substance Use Topics  . Smoking status: Former Smoker    Packs/day: 0.25    Years: 40.00    Types: Cigarettes    Quit date: 03/19/2013  . Smokeless tobacco: Never Used  . Alcohol use No    Review of Systems  DATA OBTAINED: from patient, family member; Patient only says he is fine GENERAL:  no fevers, fatigue, appetite changes; per wife failing more SKIN: No itching, rash HEENT: No complaint RESPIRATORY: No cough, wheezing, SOB CARDIAC: No chest pain, palpitations, lower extremity edema  GI: No abdominal pain, No N/V/D or constipation, No heartburn or reflux  GU: No dysuria, frequency or  urgency, or incontinence  MUSCULOSKELETAL: No unrelieved bone/joint pain NEUROLOGIC: No headache, dizziness; increased ataxia per wife  PSYCHIATRIC: No overt anxiety or sadness  Vitals:   12/29/16 1355  BP: 139/75  Pulse: 61  Resp: (!) 22  Temp: 97.7 F (36.5 C)   Body mass index is 30.56 kg/m. Physical Exam  GENERAL APPEARANCE: Alert, conversant, No acute distress; patient is out of his room less in the last few months and he has been prior, patient appears more tired than he has prior  SKIN: No diaphoresis rash HEENT: Unremarkable RESPIRATORY: Breathing is even, unlabored. Lung sounds are clear   CARDIOVASCULAR: Heart RRR no murmurs, rubs or gallops. No peripheral edema  GASTROINTESTINAL: Abdomen is soft, non-tender, not distended w/ normal bowel sounds.  GENITOURINARY:  Bladder non tender, not distended  MUSCULOSKELETAL: No abnormal joints or musculature NEUROLOGIC: Cranial nerves 2-12 grossly intact; lower extremity stiffness and weakness with braces; upper extremity ataxia chronic, progressing PSYCHIATRIC: Mood and affect appropriate to situation, no behavioral issues  Patient Active Problem List   Diagnosis Date Noted  . 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 Movement disorder 09/20/2015  . Aspiration pneumonia ([place]) 04/23/2015  . UTI (urinary tract infection) 10/26/2014  . PVD (peripheral vascular disease) ([place]) 10/26/2014  . OA (osteoarthritis) 07/20/2014  . Duodenal ulcer 07/20/2014  . CHF (congestive heart failure) ([place]) 07/20/2014  . Pleural effusion 07/20/2014  . AR (allergic rhinitis) 07/20/2014  . Insulin use (long-term) in type 2 diabetes ([place]) 07/20/2014  . Dysphagia, oropharyngeal 06/22/2014  . Anemia of chronic disease 02/15/2014  . Hypertensive heart disease with CHF (congestive heart failure) ([place]) 02/15/2014  . PNA (pneumonia) 02/15/2014  . Chronic diarrhea 02/15/2014  . DM (diabetes mellitus), type 2 with peripheral vascular complications ([place])  02/72/5366  . GERD (gastroesophageal reflux disease)   . Rheumatoid arthritis ([place])   . Hyperlipidemia   . BPH (benign prostatic hyperplasia)   . 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 Pulmonary infiltrates 12/22/2012  . COPD  undetermined GOLD status 12/22/2012  . Smoker 12/22/2012    CMP     Component Value Date/Time   NA 139 12/22/2016   K 4.3 12/22/2016   BUN 19 12/22/2016   CREATININE 0.6 12/22/2016   AST 13 (A) 04/14/2016   ALT 6 (A) 04/14/2016   ALKPHOS 149 (A) 04/14/2016    Recent Labs  04/14/16 06/24/16 12/22/16  NA 137 141 139  K 5.2 4.8 4.3  BUN 19 15 19   CREATININE 0.7 0.7 0.6    Recent Labs  04/14/16  AST 13*  ALT 6*  ALKPHOS 149*    Recent Labs  06/24/16 09/14/16 12/22/16  WBC 3.8 5.8 4.0  HGB 14.6 14.0 13.7  HCT 45 43 41  PLT 330 323 337    Recent Labs  06/09/16  CHOL 141  LDLCALC 81  TRIG 159   Lab Results  Component Value Date   MICROALBUR 3.5 07/31/2016   Lab Results  Component Value Date   TSH 3.72 04/14/2016   Lab Results  Component Value Date   HGBA1C 10.4 09/20/2016   Lab Results  Component Value Date   CHOL 141 06/09/2016   HDL 28 (A) 06/09/2016   LDLCALC 81 06/09/2016   TRIG 159 06/09/2016    Significant Diagnostic Results in last 30 days:  No results found.  Assessment and Plan  DM (diabetes mellitus), type 2 with peripheral vascular complications Patient currently on Toujeo 45 units at night and metformin 1250 mg twice a day. 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    Justin Delaine. Sheppard Coil, MD

## 2017-01-02 ENCOUNTER — Encounter: Payer: Self-pay | Admitting: Internal Medicine

## 2017-01-02 NOTE — Assessment & Plan Note (Signed)
Stable and controlled; plan to continue metoprolol 25 mg by mouth twice a day and Demadex 7.5 mg by mouth daily

## 2017-01-02 NOTE — Assessment & Plan Note (Signed)
Known aspirations or recent infections; patient has a dysphagia waiver so he drinks thin liquids patient is doing well; we'll continue to monitor

## 2017-01-02 NOTE — Assessment & Plan Note (Signed)
Chronic and stable no exacerbations; continue Demadex 7.5 mg by mouth daily and metoprolol 25 mg by mouth twice a day

## 2017-01-05 ENCOUNTER — Non-Acute Institutional Stay (SKILLED_NURSING_FACILITY): Payer: Medicare Other | Admitting: Internal Medicine

## 2017-01-05 ENCOUNTER — Encounter: Payer: Self-pay | Admitting: Internal Medicine

## 2017-01-05 DIAGNOSIS — J189 Pneumonia, unspecified organism: Secondary | ICD-10-CM | POA: Diagnosis not present

## 2017-01-05 DIAGNOSIS — R059 Cough, unspecified: Secondary | ICD-10-CM

## 2017-01-05 DIAGNOSIS — R05 Cough: Secondary | ICD-10-CM | POA: Diagnosis not present

## 2017-01-05 DIAGNOSIS — R270 Ataxia, unspecified: Secondary | ICD-10-CM

## 2017-01-05 NOTE — Progress Notes (Signed)
Location:  [facility] Room Number: [phone] Place of Service:  SNF ([id number])  Hennie Duos, MD  Patient Care Team: Hennie Duos, MD as PCP - General (Internal Medicine)  Extended Emergency Contact Information Primary Emergency Contact: Osvaldo Shipper Address: [address] [address] [place] Phone: [phone] Relation: Spouse    Allergies: Primaxin [imipenem]  Chief Complaint  Patient presents with  . Acute Visit    possible pneumonia    HPI: Patient is 71 y.o. male who Is being seen for complaint of aching and sore throat onset yesterday. Patient admits he does have a cough, and admits to left chest pain. So complains a little lightheaded no component of vertigo. Patient has a wet congestive cough. Nothing makes it better and nothing makes it worse. 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  Past Medical History:  Diagnosis Date  . Anemia of chronic disease 02/15/2014  . Aspiration pneumonia ([place]) 04/23/2015  . BPH (benign prostatic hyperplasia)   . 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 Chronic diarrhea 02/15/2014  . COPD  undetermined GOLD status 12/22/2012   Followed in Pulmonary clinic/ [facility]/ [place] - rec trial off acei 12/22/2012  - Spirometry 02/05/2013 > uninterpretable F/V but doe not appear to have severe obst   . Coronary artery disease   . Diabetes mellitus without complication ([place])   . DM (diabetes mellitus), type 2 with peripheral vascular complications ([place]) 41/07/2438  . Dysphagia, oropharyngeal 06/22/2014  . 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 GERD (gastroesophageal reflux disease)   . Heart failure, unspecified ([place])   . Hyperlipidemia   . Hypertensive heart disease with CHF (congestive heart failure) ([place]) 02/15/2014  . Myoneural disorder ([place])   . OA (osteoarthritis) 07/20/2014  . Other insomnia   . Pulmonary  infiltrates 12/22/2012   [facility] [place]  - CT chest 02/13/13  Mass in  R Lung base adj to lateral chest wall and pleural thickening > rec IR bx - PET    03/08/2013 >  Assoc with small effusion > 03/21/2013 CT bx > pus MODERATE MICROAEROPHILIC STREPTOCOCCI > rx augmentin x 10 d  completed 04/07/13 > improved 04/28/13       . PVD (peripheral vascular disease) ([place]) 10/26/2014  . Rheumatoid arthritis ([place])   . Sleep apnea, unspecified   . Vitamin D deficiency, unspecified     Past Surgical History:  Procedure Laterality Date  . INTERTROCHANTERIC HIP FRACTURE SURGERY Left 02/08/2004   open reduction and internal fixation utilizing [employer] intertrochanteric nail Dr. Paralee Cancel     Allergies as of 01/05/2017      Reactions   Primaxin [imipenem]       Medication List       Accurate as of 01/05/17  2:25 PM. Always use your most recent med list.          acetaminophen 325 MG tablet Commonly known as:  TYLENOL Take 650 mg by mouth every 6 (six) hours as needed for mild pain. 325 mg caplet administer 2 tablets q6h prn for temp greater than 99.5   CALCIUM 500 PO Take 1 tablet by mouth 3 (three) times daily.   celecoxib 200 MG capsule Commonly known as:  CELEBREX Take 200 mg by mouth daily. For rheumatoid arthritits   cetirizine 10 MG tablet Commonly known as:  ZYRTEC  Take 10 mg by mouth daily. For allergies   clonazePAM 0.5 MG tablet Commonly known as:  KLONOPIN Take 0.25 mg by mouth at bedtime.   finasteride 5 MG tablet Commonly known as:  PROSCAR Take 5 mg by mouth daily.   gemfibrozil 600 MG tablet Commonly known as:  LOPID Take 600 mg by mouth 2 (two) times daily before a meal.   hydrocortisone butyrate 0.1 % Crea cream Commonly known as:  LUCOID Apply 1 application topically daily. To bilateral arms For contact dermatitis   KLOR-CON 10 PO Take 1 tablet by mouth daily. For potassium supplement   magnesium oxide 400 MG  tablet Commonly known as:  MAG-OX Take 400 mg by mouth daily.   metFORMIN 500 MG tablet Commonly known as:  GLUCOPHAGE Give 2 and 1/2 tablets (1250 mg)  po twice daily   metoprolol tartrate 25 MG tablet Commonly known as:  LOPRESSOR Take 25 mg by mouth 2 (two) times daily. For HTN   NITROSTAT 0.4 MG SL tablet Generic drug:  nitroGLYCERIN Place 0.4 mg under the tongue every 5 (five) minutes as needed for chest pain.   PERIDEX 0.12 % solution Generic drug:  chlorhexidine Use as directed 15 mLs in the mouth or throat at bedtime.   PLAQUENIL 200 MG tablet Generic drug:  hydroxychloroquine Take 200 mg by mouth 2 (two) times daily. For rheumatoid arthritis   ranitidine 75 MG tablet Commonly known as:  ZANTAC Take 150 mg by mouth 2 (two) times daily.   tamsulosin 0.4 MG Caps capsule Commonly known as:  FLOMAX Take 0.8 mg by mouth at bedtime.   THERA Tabs Give 1 tablet by mouth daily. multivitains therapeutic   torsemide 5 MG tablet Commonly known as:  DEMADEX Give 1 and 1/2 tablet 7.5 mg po daily for pleural effusion (torsemide)   TOUJEO SOLOSTAR [place] Inject 45 Units into the skin at bedtime.   Vitamin D3 50000 units Caps Take 1 capsule by mouth every 30 (thirty) days. On the 15th of every month       No orders of the defined types were placed in this encounter.   Immunization History  Administered Date(s) Administered  . Influenza Whole 02/19/2013  . Influenza-Unspecified 02/26/2014, 02/10/2015, 02/14/2016  . PPD Test 03/05/2009  . Pneumococcal-Unspecified 03/17/2010    Social History  Substance Use Topics  . Smoking status: Former Smoker    Packs/day: 0.25    Years: 40.00    Types: Cigarettes    Quit date: 03/19/2013  . Smokeless tobacco: Never Used  . Alcohol use No    Review of Systems  DATA OBTAINED: from patient GENERAL:  Low-grade fevers, fatigue, appetite changes SKIN: No itching, rash HEENT: No complaint RESPIRATORY: + cough, no wheezing,  +SOB CARDIAC: Left side chest pain, no palpitations, lower extremity edema  GI: No abdominal pain, No N/V/D or constipation, No heartburn or reflux  GU: No dysuria, frequency or urgency, or incontinence  MUSCULOSKELETAL: No unrelieved bone/joint pain NEUROLOGIC: No headache, dizziness  PSYCHIATRIC: No overt anxiety or sadness  Vitals:   01/05/17 1417  BP: 124/72  Pulse: 70  Resp: 18  Temp: 98 F (36.7 C)   Body mass index is 30.28 kg/m. Physical Exam  GENERAL APPEARANCE: Alert, conversant, No acute distress  SKIN: No diaphoresis rash HEENT: Unremarkable RESPIRATORY: Breathing is even, unlabored. Lung sounds are Decreased left base, no no wheezing ; congested cough; O2 sat at bedside is 93% CARDIOVASCULAR: Heart RRR no murmurs, rubs or gallops. No peripheral  edema  GASTROINTESTINAL: Abdomen is soft, non-tender, not distended w/ normal bowel sounds.  GENITOURINARY: Bladder non tender, not distended  MUSCULOSKELETAL: Stiffness bilateral lower extremities NEUROLOGIC: Cranial nerves 2-12 grossly intact; decreased movement bilateral lower extremities; ataxia of bilateral upper extremities PSYCHIATRIC: Mood and affect appropriate to situation, no behavioral issues  Patient Active Problem List   Diagnosis Date Noted  . 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 Movement disorder 09/20/2015  . Aspiration pneumonia ([place]) 04/23/2015  . UTI (urinary tract infection) 10/26/2014  . PVD (peripheral vascular disease) ([place]) 10/26/2014  . OA (osteoarthritis) 07/20/2014  . Duodenal ulcer 07/20/2014  . CHF (congestive heart failure) ([place]) 07/20/2014  . Pleural effusion 07/20/2014  . AR (allergic rhinitis) 07/20/2014  . Insulin use (long-term) in type 2 diabetes ([place]) 07/20/2014  . Dysphagia, oropharyngeal 06/22/2014  . Anemia of chronic disease 02/15/2014  . Hypertensive heart disease with CHF (congestive heart failure) ([place]) 02/15/2014  . PNA (pneumonia) 02/15/2014  . Chronic diarrhea  02/15/2014  . DM (diabetes mellitus), type 2 with peripheral vascular complications ([place]) 26/71/2458  . GERD (gastroesophageal reflux disease)   . Rheumatoid arthritis ([place])   . Hyperlipidemia   . BPH (benign prostatic hyperplasia)   . 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 Pulmonary infiltrates 12/22/2012  . COPD  undetermined GOLD status 12/22/2012  . Smoker 12/22/2012    CMP     Component Value Date/Time   NA 139 12/22/2016   K 4.3 12/22/2016   BUN 19 12/22/2016   CREATININE 0.6 12/22/2016   AST 13 (A) 04/14/2016   ALT 6 (A) 04/14/2016   ALKPHOS 149 (A) 04/14/2016    Recent Labs  04/14/16 06/24/16 12/22/16  NA 137 141 139  K 5.2 4.8 4.3  BUN 19 15 19   CREATININE 0.7 0.7 0.6    Recent Labs  04/14/16  AST 13*  ALT 6*  ALKPHOS 149*    Recent Labs  06/24/16 09/14/16 12/22/16  WBC 3.8 5.8 4.0  HGB 14.6 14.0 13.7  HCT 45 43 41  PLT 330 323 337    Recent Labs  06/09/16  CHOL 141  LDLCALC 81  TRIG 159   Lab Results  Component Value Date   MICROALBUR 3.5 07/31/2016   Lab Results  Component Value Date   TSH 3.72 04/14/2016   Lab Results  Component Value Date   HGBA1C 10.4 09/20/2016   Lab Results  Component Value Date   CHOL 141 06/09/2016   HDL 28 (A) 06/09/2016   LDLCALC 81 06/09/2016   TRIG 159 06/09/2016    Significant Diagnostic Results in last 30 days:  No results found.  Assessment and Plan  COUGH/ HCAP/UPPER EXTREMITY ATAXIA-patient has look like this prior and when his looked like this he has had pneumonia that; that along with decreased breath sounds in left base) since going to be pneumonia I have started patient on Augmentin 875 every 127 days I have ordered a chest x-ray to confirm; Robitussin-DM 3 teaspoons 3 times a day schedule for 7 days and albuterol nebs 3 times a day for 7 days  Later entry-chest x-ray with left lower lobe infiltrate; wife is present at the skilled nursing facility now; have ordered O2 for patient at night 2 L  nasal cannula, he says he has trouble sleeping with this and during her discussion of  sleeping we have decided to increase his clonazepam from 0.25 mg daily at bedtime for movement disorder to 0.5 mg daily at bedtime for movement disorder and for  sleep; of note we have been wanting to increase his evening Klonopin for his upper extremity ataxia and patient has been resistant prior to now    Time spent greater than 35 minutes Anne D. Sheppard Coil, MD

## 2017-01-06 ENCOUNTER — Encounter: Payer: Self-pay | Admitting: Internal Medicine

## 2017-01-09 ENCOUNTER — Encounter: Payer: Self-pay | Admitting: Internal Medicine

## 2017-01-09 NOTE — Assessment & Plan Note (Signed)
Chronic, may be progressing, it's hard to tell whether it's RA or Eaton-Lambert or probably both the progressing-patient has noticeably been failing in the past 3-4 months; plan to continue Plaquenil 200 mg by mouth twice a day and Celebrex 200 mg by mouth twice a day.

## 2017-01-09 NOTE — Assessment & Plan Note (Signed)
Patient currently on Toujeo 45 units at night and metformin 1250 mg twice a day. Patient was medically concerted effort to cut down on his caloric intake and carbohydrate intake and his last A1c was 7.6 which is excellent; continue current plan

## 2017-01-09 NOTE — Assessment & Plan Note (Signed)
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

## 2017-01-15 DEATH — deceased
